# Patient Record
Sex: Female | Born: 1980
Health system: Southern US, Community
[De-identification: ages and names within clinical notes are randomized; demographics above are authoritative.]

## PROBLEM LIST (undated history)

## (undated) DIAGNOSIS — K760 Fatty (change of) liver, not elsewhere classified: Secondary | ICD-10-CM

## (undated) HISTORY — DX: Fatty (change of) liver, not elsewhere classified: K76.0

---
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
## 2003-11-06 ENCOUNTER — Emergency Department (HOSPITAL_COMMUNITY): Admission: EM | Admit: 2003-11-06 | Discharge: 2003-11-07 | Payer: Self-pay | Admitting: Emergency Medicine

## 2006-03-26 ENCOUNTER — Emergency Department (HOSPITAL_COMMUNITY): Admission: EM | Admit: 2006-03-26 | Discharge: 2006-03-26 | Payer: Self-pay | Admitting: Emergency Medicine

## 2011-04-17 ENCOUNTER — Ambulatory Visit (INDEPENDENT_AMBULATORY_CARE_PROVIDER_SITE_OTHER): Payer: BC Managed Care – PPO

## 2011-04-17 DIAGNOSIS — R059 Cough, unspecified: Secondary | ICD-10-CM

## 2011-04-17 DIAGNOSIS — J111 Influenza due to unidentified influenza virus with other respiratory manifestations: Secondary | ICD-10-CM

## 2011-04-17 DIAGNOSIS — R05 Cough: Secondary | ICD-10-CM

## 2012-09-19 ENCOUNTER — Ambulatory Visit (INDEPENDENT_AMBULATORY_CARE_PROVIDER_SITE_OTHER): Payer: BC Managed Care – PPO | Admitting: Internal Medicine

## 2012-09-19 VITALS — BP 142/90 | HR 92 | Temp 98.3°F | Resp 16 | Ht 63.0 in | Wt 225.0 lb

## 2012-09-19 DIAGNOSIS — J029 Acute pharyngitis, unspecified: Secondary | ICD-10-CM

## 2012-09-19 DIAGNOSIS — R05 Cough: Secondary | ICD-10-CM

## 2012-09-19 DIAGNOSIS — J329 Chronic sinusitis, unspecified: Secondary | ICD-10-CM

## 2012-09-19 MED ORDER — AMOXICILLIN 500 MG PO CAPS: 1000.0000 mg | ORAL_CAPSULE | Freq: Two times a day (BID) | ORAL | Status: DC

## 2012-09-19 NOTE — Patient Instructions (Addendum)

## 2012-09-19 NOTE — Progress Notes (Signed)
  Subjective:    Patient ID: Amber Stein, female    DOB: 16-Dec-1980, 32 y.o.   MRN: [id number]  HPI Patient complains of sinus pressure, sore throat, congestion and cough starting on Tuesday.  Patient did record a fever of 99.8 this am and took ibuprofen to bring temperature down.  Coughing up yellow sputum.  Patient's ears feel clogged as well. Patient denies SOB or chest pain.     Review of Systems     Objective:   Physical Exam  Vitals reviewed. Constitutional: She is oriented to person, place, and time. She appears well-developed and well-nourished. No distress.  HENT:  Right Ear: External ear normal.  Left Ear: External ear normal.  Nose: Mucosal edema, rhinorrhea and sinus tenderness present. No epistaxis. Right sinus exhibits maxillary sinus tenderness and frontal sinus tenderness. Left sinus exhibits maxillary sinus tenderness and frontal sinus tenderness.  Mouth/Throat: Oropharynx is clear and moist.  Pulmonary/Chest: Effort normal.  Neurological: She is alert and oriented to person, place, and time. She exhibits normal muscle tone. Coordination normal.  Skin: No rash noted.  Psychiatric: She has a normal mood and affect.          Assessment & Plan:  Amoxil 10 d

## 2013-04-23 ENCOUNTER — Ambulatory Visit (INDEPENDENT_AMBULATORY_CARE_PROVIDER_SITE_OTHER): Payer: BC Managed Care – PPO | Admitting: Family Medicine

## 2013-04-23 VITALS — BP 144/90 | HR 92 | Temp 98.8°F | Resp 16 | Ht 63.25 in | Wt 245.0 lb

## 2013-04-23 DIAGNOSIS — J329 Chronic sinusitis, unspecified: Secondary | ICD-10-CM

## 2013-04-23 DIAGNOSIS — R059 Cough, unspecified: Secondary | ICD-10-CM

## 2013-04-23 DIAGNOSIS — J209 Acute bronchitis, unspecified: Secondary | ICD-10-CM

## 2013-04-23 DIAGNOSIS — R05 Cough: Secondary | ICD-10-CM

## 2013-04-23 MED ORDER — BENZONATATE 100 MG PO CAPS: ORAL_CAPSULE | ORAL | Status: DC

## 2013-04-23 MED ORDER — AMOXICILLIN 875 MG PO TABS: 875.0000 mg | ORAL_TABLET | Freq: Two times a day (BID) | ORAL | Status: DC

## 2013-04-23 MED ORDER — HYDROCODONE-HOMATROPINE 5-1.5 MG/5ML PO SYRP: 5.0000 mL | ORAL_SOLUTION | ORAL | Status: DC | PRN

## 2013-04-23 NOTE — Progress Notes (Signed)
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 She has been getting sick for about 5 days. It started first sore throat, then she developed more cough and head congestion. She is now blowing and coughing purulent green-looking material. She's not improving. She's not febrile. She does not feel the achy bad feel a funny influenza although a coworker did have influenza.  Objective: Mildly ill appearing with coughing. Her TMs are normal. Eyes normal. Throat mildly injected. Neck supple without significant nodes. Chest is clear to. Heart regular without murmurs. No wheezing was noted.  Assessment: Upper respiratory infection with secondary bronchitis and sinusitis  Plan: Hycodan, Tessalon, and amoxicillin  Return if not improving or getting worse at anytime

## 2013-04-23 NOTE — Patient Instructions (Signed)
Drink plenty of fluids and get enough rest  Good handwashing  Take amoxicillin one twice daily for infection  Use the hydrocodone cough syrup (Hycodan) 1 teaspoon every 4-6 hours when necessary. It will make you drowsy.  Take Tessalon (benzonatate) cough pills 3 times daily if needed for cough, nonsedating. Not as good as the cough syrup.

## 2013-06-23 ENCOUNTER — Ambulatory Visit (INDEPENDENT_AMBULATORY_CARE_PROVIDER_SITE_OTHER): Payer: BC Managed Care – PPO | Admitting: Physician Assistant

## 2013-06-23 VITALS — BP 134/82 | HR 107 | Temp 98.6°F | Resp 20 | Ht 64.0 in | Wt 256.0 lb

## 2013-06-23 DIAGNOSIS — J111 Influenza due to unidentified influenza virus with other respiratory manifestations: Secondary | ICD-10-CM

## 2013-06-23 DIAGNOSIS — Z20828 Contact with and (suspected) exposure to other viral communicable diseases: Secondary | ICD-10-CM

## 2013-06-23 DIAGNOSIS — R69 Illness, unspecified: Principal | ICD-10-CM

## 2013-06-23 DIAGNOSIS — R509 Fever, unspecified: Secondary | ICD-10-CM

## 2013-06-23 LAB — POCT CBC
GRANULOCYTE PERCENT: 74.3 % (ref 37–80)
HCT, POC: 39.5 % (ref 37.7–47.9)
Hemoglobin: 12.7 g/dL (ref 12.2–16.2)
Lymph, poc: 1.7 (ref 0.6–3.4)
MCH, POC: 29.1 pg (ref 27–31.2)
MCHC: 32.2 g/dL (ref 31.8–35.4)
MCV: 90.7 fL (ref 80–97)
MID (CBC): 0.8 (ref 0–0.9)
MPV: 9 fL (ref 0–99.8)
POC GRANULOCYTE: 7.1 — AB (ref 2–6.9)
POC LYMPH %: 17.6 % (ref 10–50)
POC MID %: 8.1 %M (ref 0–12)
Platelet Count, POC: 264 10*3/uL (ref 142–424)
RBC: 4.36 M/uL (ref 4.04–5.48)
RDW, POC: 13.3 %
WBC: 9.5 10*3/uL (ref 4.6–10.2)

## 2013-06-23 LAB — POCT INFLUENZA A/B
INFLUENZA A, POC: NEGATIVE
Influenza B, POC: NEGATIVE

## 2013-06-23 MED ORDER — OSELTAMIVIR PHOSPHATE 75 MG PO CAPS: 75.0000 mg | ORAL_CAPSULE | Freq: Two times a day (BID) | ORAL | Status: DC

## 2013-06-23 MED ORDER — BENZONATATE 100 MG PO CAPS: 100.0000 mg | ORAL_CAPSULE | Freq: Three times a day (TID) | ORAL | Status: DC | PRN

## 2013-06-23 MED ORDER — HYDROCODONE-HOMATROPINE 5-1.5 MG/5ML PO SYRP: 5.0000 mL | ORAL_SOLUTION | Freq: Three times a day (TID) | ORAL | Status: DC | PRN

## 2013-06-23 NOTE — Progress Notes (Signed)
Subjective:    Patient ID: Amber Stein, female    DOB: 1981/01/18, 33 y.o.   MRN: [id number]  HPI   [name] is a very pleasant 33 yr old female here with concern for illness. Symptoms began yesterday morning.  Symptoms include sore throat, cough, "mucus," HA, upset stomach, body aches.  Fever tmax 100.57F.  Cough is mostly dry but occ productive of yellow mucus.  Abrupt onset of symptoms.  Pt works at dialysis center, has had lots of pts with flu.  She did have her flu shot this year.  Has been using ibuprofen and mucinex for symptoms.     Review of Systems  Constitutional: Positive for fever.  HENT: Positive for congestion, rhinorrhea and sore throat. Negative for ear pain.   Respiratory: Positive for cough. Negative for shortness of breath and wheezing.   Cardiovascular: Negative.   Gastrointestinal: Positive for nausea. Negative for vomiting and abdominal pain.  Musculoskeletal: Positive for arthralgias and myalgias.  Skin: Negative.   Neurological: Positive for headaches.       Objective:   Physical Exam  Vitals reviewed. Constitutional: She is oriented to person, place, and time. She appears well-developed and well-nourished. No distress.  HENT:  Head: Normocephalic and atraumatic.  Right Ear: Tympanic membrane is injected.  Left Ear: Tympanic membrane is injected.  Nose: Mucosal edema and rhinorrhea present.  Mouth/Throat: Uvula is midline and mucous membranes are normal. Posterior oropharyngeal erythema present. No oropharyngeal exudate or posterior oropharyngeal edema.  Eyes: Conjunctivae are normal. No scleral icterus.  Neck: Neck supple.  Cardiovascular: Normal rate, regular rhythm and normal heart sounds.   Pulmonary/Chest: Effort normal and breath sounds normal. She has no wheezes. She has no rales.  Abdominal: Soft. There is no tenderness.  Lymphadenopathy:    She has no cervical adenopathy.  Neurological: She is alert and oriented to person, place, and  time.  Skin: Skin is warm and dry.  Psychiatric: She has a normal mood and affect. Her behavior is normal.    Results for orders placed in visit on 06/23/13  POCT CBC      Result Value Ref Range   WBC 9.5  4.6 - 10.2 K/uL   Lymph, poc 1.7  0.6 - 3.4   POC LYMPH PERCENT 17.6  10 - 50 %L   MID (cbc) 0.8  0 - 0.9   POC MID % 8.1  0 - 12 %M   POC Granulocyte 7.1 (*) 2 - 6.9   Granulocyte percent 74.3  37 - 80 %G   RBC 4.36  4.04 - 5.48 M/uL   Hemoglobin 12.7  12.2 - 16.2 g/dL   HCT, POC 40.9  73.5 - 47.9 %   MCV 90.7  80 - 97 fL   MCH, POC 29.1  27 - 31.2 pg   MCHC 32.2  31.8 - 35.4 g/dL   RDW, POC 32.9     Platelet Count, POC 264  142 - 424 K/uL   MPV 9.0  0 - 99.8 fL  POCT INFLUENZA A/B      Result Value Ref Range   Influenza A, POC Negative     Influenza B, POC Negative           Assessment & Plan:  1. Influenza-like illness [name] is a very pleasant 33 yr old female here with influenza-like illness.  Abrupt onset of flu-like symptoms yesterday morning.  Known flu exposures at work.  Flu test is negative today.  CBC is  normal.  Will treat presumptively with tamiflu.  Tessalon and Hycodan for cough.  Advil, Tylenol for fever reduction.  Push fluids, rest.  OOW until fever free for 24 hours without medication  - benzonatate (TESSALON) 100 MG capsule; Take 1-2 capsules (100-200 mg total) by mouth 3 (three) times daily as needed for cough.  Dispense: 40 capsule; Refill: 0 - HYDROcodone-homatropine (HYCODAN) 5-1.5 MG/5ML syrup; Take 5 mLs by mouth every 8 (eight) hours as needed for cough.  Dispense: 30 mL; Refill: 0 - oseltamivir (TAMIFLU) 75 MG capsule; Take 1 capsule (75 mg total) by mouth 2 (two) times daily.  Dispense: 10 capsule; Refill: 0  2. Fever, unspecified - POCT CBC - POCT Influenza A/B  3. Exposure to influenza  Pt to call or RTC if worsening or not improving  E. Frances FurbishElizabeth Ieasha Boerema MHS, PA-C [facility] 2/24/201512:53 PM

## 2013-06-23 NOTE — Patient Instructions (Signed)
Begin taking Tamiflu tonight.  Finish the full course.  Tylenol and/or Advil to keep fever down  Tessalon Perles every 8 hours as needed for cough  Hycodan syrup if needed for cough at bed time - will make you sleepy  Plenty of fluids (water is best!) and rest  Out of work until fever free for 24 hours without Advil or Tylenol  Please let us know if any symptoms are worsening or not improving   Influenza, Adult Influenza ("the flu") is a viral infection of the respiratory tract. It occurs more often in winter months because people spend more time in close contact with one another. Influenza can make you feel very sick. Influenza easily spreads from person to person (contagious). CAUSES  Influenza is caused by a virus that infects the respiratory tract. You can catch the virus by breathing in droplets from an infected person's cough or sneeze. You can also catch the virus by touching something that was recently contaminated with the virus and then touching your mouth, nose, or eyes. SYMPTOMS  Symptoms typically last 4 to 10 days and may include:  Fever.  Chills.  Headache, body aches, and muscle aches.  Sore throat.  Chest discomfort and cough.  Poor appetite.  Weakness or feeling tired.  Dizziness.  Nausea or vomiting. DIAGNOSIS  Diagnosis of influenza is often made based on your history and a physical exam. A nose or throat swab test can be done to confirm the diagnosis. RISKS AND COMPLICATIONS You may be at risk for a more severe case of influenza if you smoke cigarettes, have diabetes, have chronic heart disease (such as heart failure) or lung disease (such as asthma), or if you have a weakened immune system. Elderly people and pregnant women are also at risk for more serious infections. The most common complication of influenza is a lung infection (pneumonia). Sometimes, this complication can require emergency medical care and may be life-threatening. PREVENTION  An  annual influenza vaccination (flu shot) is the best way to avoid getting influenza. An annual flu shot is now routinely recommended for all adults in the [place] TREATMENT  In mild cases, influenza goes away on its own. Treatment is directed at relieving symptoms. For more severe cases, your caregiver may prescribe antiviral medicines to shorten the sickness. Antibiotic medicines are not effective, because the infection is caused by a virus, not by bacteria. HOME CARE INSTRUCTIONS  Only take over-the-counter or prescription medicines for pain, discomfort, or fever as directed by your caregiver.  Use a cool mist humidifier to make breathing easier.  Get plenty of rest until your temperature returns to normal. This usually takes 3 to 4 days.  Drink enough fluids to keep your urine clear or pale yellow.  Cover your mouth and nose when coughing or sneezing, and wash your hands well to avoid spreading the virus.  Stay home from work or school until your fever has been gone for at least 1 full day. SEEK MEDICAL CARE IF:   You have chest pain or a deep cough that worsens or produces more mucus.  You have nausea, vomiting, or diarrhea. SEEK IMMEDIATE MEDICAL CARE IF:   You have difficulty breathing, shortness of breath, or your skin or nails turn bluish.  You have severe neck pain or stiffness.  You have a severe headache, facial pain, or earache.  You have a worsening or recurring fever.  You have nausea or vomiting that cannot be controlled. MAKE SURE YOU:  Understand these instructions.  Will watch your condition.  Will get help right away if you are not doing well or get worse. Document Released: 04/14/2000 Document Revised: 10/17/2011 Document Reviewed: 07/17/2011 [facility] Patient Information 2014 [place], [place].

## 2014-04-03 ENCOUNTER — Ambulatory Visit (INDEPENDENT_AMBULATORY_CARE_PROVIDER_SITE_OTHER): Payer: BC Managed Care – PPO | Admitting: Internal Medicine

## 2014-04-03 VITALS — BP 134/80 | HR 94 | Temp 98.3°F | Resp 18 | Ht 64.0 in | Wt 266.4 lb

## 2014-04-03 DIAGNOSIS — R69 Illness, unspecified: Secondary | ICD-10-CM

## 2014-04-03 DIAGNOSIS — J0100 Acute maxillary sinusitis, unspecified: Secondary | ICD-10-CM

## 2014-04-03 DIAGNOSIS — J111 Influenza due to unidentified influenza virus with other respiratory manifestations: Secondary | ICD-10-CM

## 2014-04-03 MED ORDER — AMOXICILLIN 500 MG PO CAPS: 1000.0000 mg | ORAL_CAPSULE | Freq: Two times a day (BID) | ORAL | Status: AC

## 2014-04-03 MED ORDER — HYDROCODONE-HOMATROPINE 5-1.5 MG/5ML PO SYRP: 5.0000 mL | ORAL_SOLUTION | Freq: Three times a day (TID) | ORAL | Status: DC | PRN

## 2014-04-03 NOTE — Progress Notes (Signed)
Subjective:    Patient ID: Amber Stein, female    DOB: 07-29-1980, 33 y.o.   MRN: [id number]  This chart was scribed for Ellamae Sia, MD by Ronney Lion, ED Scribe. This patient was seen in room 13 and the patient's care was started at 11:38 AM.   Sinusitis Associated symptoms include congestion, coughing and sinus pressure.  Sore Throat  Associated symptoms include congestion and coughing.  Cough    HPI Comments: Amber Stein is a 33 y.o. female who presents to the [facility] complaining of congestion that began about a week ago. She notes associated facial pressure, ear popping, a fever at onset of illness that has since resolved, and productive coughing that worsens at night. She has tried Mucinex, Robitussin, and Sudafed for her symptoms.   Active Ambulatory Problems    Diagnosis Date Noted  . No Active Ambulatory Problems   Resolved Ambulatory Problems    Diagnosis Date Noted  . No Resolved Ambulatory Problems   No Additional Past Medical History   Prior to Admission medications   Medication Sig Start Date End Date Taking? Authorizing Provider  amoxicillin (AMOXIL) 500 MG capsule Take 2 capsules (1,000 mg total) by mouth 2 (two) times daily. 04/03/14 04/13/14  Tonye Pearson, MD  benzonatate (TESSALON) 100 MG capsule Take 1-2 capsules (100-200 mg total) by mouth 3 (three) times daily as needed for cough. Patient not taking: Reported on 04/03/2014 06/23/13   Godfrey Pick, PA-C  HYDROcodone-homatropine [facility]) 5-1.5 MG/5ML syrup Take 5 mLs by mouth every 8 (eight) hours as needed for cough. 04/03/14   Tonye Pearson, MD  oseltamivir (TAMIFLU) 75 MG capsule Take 1 capsule (75 mg total) by mouth 2 (two) times daily. Patient not taking: Reported on 04/03/2014 06/23/13   Godfrey Pick, PA-C   has No Known Allergies.   Review of Systems  HENT: Positive for congestion and sinus pressure.   Respiratory: Positive for cough.          Objective:   Physical Exam  Constitutional: She is oriented to person, place, and time. She appears well-developed and well-nourished. No distress.  HENT:  Head: Normocephalic and atraumatic.  Right Ear: Tympanic membrane normal.  Left Ear: Tympanic membrane normal.  Purulent discharge from the nose.   Tender max areas to perc  Eyes: EOM are normal. Right conjunctiva is injected. Left conjunctiva is injected.  Neck: Neck supple. No tracheal deviation present.  Cardiovascular: Normal rate.   Pulmonary/Chest: Effort normal and breath sounds normal. No respiratory distress.  Lungs are clear to auscultation.  Musculoskeletal: Normal range of motion.  Neurological: She is alert and oriented to person, place, and time.  Skin: Skin is warm and dry.  Psychiatric: She has a normal mood and affect. Her behavior is normal.  Nursing note and vitals reviewed.         Assessment & Plan:   11:42 AM-Discussed treatment plan which includes Amoxicillin and Hycodan with pt and pt agreed to plan.   Acute maxillary sinusitis, recurrence not specified  Influenza-like illness - Plan: HYDROcodone-homatropine (HYCODAN) 5-1.5 MG/5ML syrup  Meds ordered this encounter  Medications  . HYDROcodone-homatropine (HYCODAN) 5-1.5 MG/5ML syrup    Sig: Take 5 mLs by mouth every 8 (eight) hours as needed for cough.    Dispense:  30 mL    Refill:  0  . amoxicillin (AMOXIL) 500 MG capsule    Sig: Take 2 capsules (1,000 mg total) by mouth 2 (two)  times daily.    Dispense:  40 capsule    Refill:  0   I have completed the patient encounter in its entirety as documented by the scribe, with editing by me where necessary. Currie Dennin P. Merla Richesoolittle, M.D.

## 2015-09-03 ENCOUNTER — Ambulatory Visit (INDEPENDENT_AMBULATORY_CARE_PROVIDER_SITE_OTHER): Payer: BLUE CROSS/BLUE SHIELD | Admitting: Emergency Medicine

## 2015-09-03 VITALS — BP 148/98 | HR 105 | Temp 98.6°F | Resp 20 | Ht 64.0 in | Wt 265.0 lb

## 2015-09-03 DIAGNOSIS — J208 Acute bronchitis due to other specified organisms: Secondary | ICD-10-CM | POA: Diagnosis not present

## 2015-09-03 DIAGNOSIS — R059 Cough, unspecified: Secondary | ICD-10-CM

## 2015-09-03 DIAGNOSIS — J0100 Acute maxillary sinusitis, unspecified: Secondary | ICD-10-CM | POA: Diagnosis not present

## 2015-09-03 DIAGNOSIS — R05 Cough: Secondary | ICD-10-CM | POA: Diagnosis not present

## 2015-09-03 DIAGNOSIS — J111 Influenza due to unidentified influenza virus with other respiratory manifestations: Secondary | ICD-10-CM

## 2015-09-03 DIAGNOSIS — R69 Illness, unspecified: Secondary | ICD-10-CM

## 2015-09-03 DIAGNOSIS — R509 Fever, unspecified: Secondary | ICD-10-CM | POA: Diagnosis not present

## 2015-09-03 LAB — POCT RAPID STREP A (OFFICE): Rapid Strep A Screen: NEGATIVE

## 2015-09-03 LAB — POCT CBC
Granulocyte percent: 73.6 %G (ref 37–80)
HEMATOCRIT: 36.4 % — AB (ref 37.7–47.9)
HEMOGLOBIN: 12.9 g/dL (ref 12.2–16.2)
Lymph, poc: 2.1 (ref 0.6–3.4)
MCH: 29.5 pg (ref 27–31.2)
MCHC: 35.3 g/dL (ref 31.8–35.4)
MCV: 83.5 fL (ref 80–97)
MID (CBC): 0.8 (ref 0–0.9)
MPV: 7.4 fL (ref 0–99.8)
POC GRANULOCYTE: 8 — AB (ref 2–6.9)
POC LYMPH PERCENT: 19.5 %L (ref 10–50)
POC MID %: 6.9 % (ref 0–12)
Platelet Count, POC: 340 10*3/uL (ref 142–424)
RBC: 4.36 M/uL (ref 4.04–5.48)
RDW, POC: 13 %
WBC: 10.9 10*3/uL — AB (ref 4.6–10.2)

## 2015-09-03 MED ORDER — AZITHROMYCIN 250 MG PO TABS: ORAL_TABLET | ORAL | Status: DC

## 2015-09-03 MED ORDER — BENZONATATE 100 MG PO CAPS: 100.0000 mg | ORAL_CAPSULE | Freq: Three times a day (TID) | ORAL | Status: DC | PRN

## 2015-09-03 MED ORDER — AMOXICILLIN-POT CLAVULANATE 875-125 MG PO TABS: 1.0000 | ORAL_TABLET | Freq: Two times a day (BID) | ORAL | Status: DC

## 2015-09-03 MED ORDER — HYDROCODONE-HOMATROPINE 5-1.5 MG/5ML PO SYRP: 5.0000 mL | ORAL_SOLUTION | Freq: Three times a day (TID) | ORAL | Status: DC | PRN

## 2015-09-03 NOTE — Progress Notes (Addendum)
By signing my name below, I, Raven Small, attest that this documentation has been prepared under the direction and in the presence of Lesle Chris, MD.  Electronically Signed: Andrew Au, ED Scribe. 09/03/2015. 8:20 AM.   Chief Complaint: No chief complaint on file.   HPI: Amber Stein is a 35 y.o. female who reports to [facility] today complaining of cough that began 5 days ago. Pt states symptoms started 6 days ago with a scratchy throat, initially suspecting this was due to allergies. The following day she developed sinus pressure, sinus congestion, sore throat, cough and a low grade fever of 99.5. She has tried mucinex and tylenol sinus HA without relief to symptoms. Symptoms have persisted throughout the week. She denies hx of asthma but has used an inhaler in the past for bronchitis. 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 She denies SOB and upper teeth pain. She is not on birth control and denies chance of pregnancy. No drug allergies.   History reviewed. No pertinent past medical history. History reviewed. No pertinent past surgical history. Social History   Social History  . Marital Status: Single    Spouse Name: N/A  . Number of Children: N/A  . Years of Education: N/A   Social History Main Topics  . Smoking status: Never Smoker   . Smokeless tobacco: None  . Alcohol Use: No  . Drug Use: No  . Sexual Activity: Not Asked   Other Topics Concern  . None   Social History Narrative   History reviewed. No pertinent family history. No Known Allergies Prior to Admission medications   Medication Sig Start Date End Date Taking? Authorizing Provider  benzonatate (TESSALON) 100 MG capsule Take 1-2 capsules (100-200 mg total) by mouth 3 (three) times daily as needed for cough. Patient not taking: Reported on 04/03/2014 06/23/13   Godfrey Pick, PA-C  HYDROcodone-homatropine [facility]) 5-1.5 MG/5ML syrup Take 5 mLs by mouth every 8 (eight) hours as needed  for cough. Patient not taking: Reported on 09/03/2015 04/03/14   Tonye Pearson, MD  oseltamivir (TAMIFLU) 75 MG capsule Take 1 capsule (75 mg total) by mouth 2 (two) times daily. Patient not taking: Reported on 04/03/2014 06/23/13   Godfrey Pick, PA-C     ROS: The patient denies, night sweats, unintentional weight loss, chest pain, palpitations, wheezing, dyspnea on exertion, nausea, vomiting, abdominal pain, dysuria, hematuria, melena, numbness, weakness, or tingling.   All other systems have been reviewed and were otherwise negative with the exception of those mentioned in the HPI and as above.    PHYSICAL EXAM: Filed Vitals:   09/03/15 0816  BP: 146/100  Pulse: 124  Temp: 99.4 F (37.4 C)  Resp: 18   Body mass index is 45.46 kg/(m^2).   General: Alert, no acute distress. Ill with frequent coughing spells. HEENT:  Normocephalic, atraumatic. Throat is slightly red.Patient is tender over both maxillary sinuses.  Eye: [facility] Cardiovascular:  Regular rate and rhythm, no rubs murmurs or gallops.  No Carotid bruits, radial pulse intact. No pedal edema.  Respiratory: Clear to auscultation bilaterally.  No wheezes, rales, or rhonchi.  No cyanosis, no use of accessory musculature. Chest was clear with high pitch cough. Abdominal: No organomegaly, abdomen is soft and non-tender, positive bowel sounds.  No masses. Musculoskeletal: Gait intact. No edema, tenderness Skin: No rashes. Neurologic: Facial musculature symmetric. Psychiatric: Patient acts appropriately throughout our interaction. Lymphatic: No cervical or submandibular lymphadenopathy  Meds ordered this encounter  Medications  . HYDROcodone-homatropine (HYCODAN) 5-1.5 MG/5ML syrup    Sig: Take 5 mLs by mouth every 8 (eight) hours as needed for cough.    Dispense:  30 mL    Refill:  0  . benzonatate (TESSALON) 100 MG capsule    Sig: Take 1-2 capsules (100-200 mg total) by mouth 3 (three) times daily as needed for  cough.    Dispense:  40 capsule    Refill:  0  . DISCONTD: azithromycin (ZITHROMAX) 250 MG tablet    Sig: Take 2 tabs PO x 1 dose, then 1 tab PO QD x 4 days    Dispense:  6 tablet    Refill:  0  . amoxicillin-clavulanate (AUGMENTIN) 875-125 MG tablet    Sig: Take 1 tablet by mouth 2 (two) times daily.    Dispense:  20 tablet    Refill:  0    LABS: Results for orders placed or performed in visit on 09/03/15  POCT CBC  Result Value Ref Range   WBC 10.9 (A) 4.6 - 10.2 K/uL   Lymph, poc 2.1 0.6 - 3.4   POC LYMPH PERCENT 19.5 10 - 50 %L   MID (cbc) 0.8 0 - 0.9   POC MID % 6.9 0 - 12 %M   POC Granulocyte 8.0 (A) 2 - 6.9   Granulocyte percent 73.6 37 - 80 %G   RBC 4.36 4.04 - 5.48 M/uL   Hemoglobin 12.9 12.2 - 16.2 g/dL   HCT, POC 56.3 (A) 14.9 - 47.9 %   MCV 83.5 80 - 97 fL   MCH, POC 29.5 27 - 31.2 pg   MCHC 35.3 31.8 - 35.4 g/dL   RDW, POC 70.2 %   Platelet Count, POC 340 142 - 424 K/uL   MPV 7.4 0 - 99.8 fL  POCT rapid strep A  Result Value Ref Range   Rapid Strep A Screen Negative Negative   ASSESSMENT/PLAN: White count is elevated. Strep Test was negative. Will treat with Augmentin and cough medications. Out of work. recheck in 48 hours if not improving.I personally performed the services described in this documentation, which was scribed in my presence. The recorded information has been reviewed and is accurate. I suspect she has a bilateral acute maxillary sinusitis. She does have a significant headache.   Gross sideeffects, risk and benefits, and alternatives of medications d/w patient. Patient is aware that all medications have potential sideeffects and we are unable to predict every sideeffect or drug-drug interaction that may occur.  Lesle Chris MD 09/03/2015 8:20 AM

## 2015-09-03 NOTE — Patient Instructions (Addendum)
Please recheck 48 hours if not improving.    IF you received an x-ray today, you will receive an invoice from [facility]. Please contact [facility] at [phone] with questions or concerns regarding your invoice.   IF you received labwork today, you will receive an invoice from [employer]. Please contact [facility] at [phone] with questions or concerns regarding your invoice.   Our billing staff will not be able to assist you with questions regarding bills from these companies.  You will be contacted with the lab results as soon as they are available. The fastest way to get your results is to activate your My Chart account. Instructions are located on the last page of this paperwork. If you have not heard from [place] regarding the results in 2 weeks, please contact this office.    Sinusitis, Adult Sinusitis is redness, soreness, and inflammation of the paranasal sinuses. Paranasal sinuses are air pockets within the bones of your face. They are located beneath your eyes, in the middle of your forehead, and above your eyes. In healthy paranasal sinuses, mucus is able to drain out, and air is able to circulate through them by way of your nose. However, when your paranasal sinuses are inflamed, mucus and air can become trapped. This can allow bacteria and other germs to grow and cause infection. Sinusitis can develop quickly and last only a short time (acute) or continue over a long period (chronic). Sinusitis that lasts for more than 12 weeks is considered chronic. CAUSES Causes of sinusitis include:  Allergies.  Structural abnormalities, such as displacement of the cartilage that separates your nostrils (deviated septum), which can decrease the air flow through your nose and sinuses and affect sinus drainage.  Functional abnormalities, such as when the small hairs (cilia) that line your sinuses and help remove mucus do not work properly or are not  present. SIGNS AND SYMPTOMS Symptoms of acute and chronic sinusitis are the same. The primary symptoms are pain and pressure around the affected sinuses. Other symptoms include:  Upper toothache.  Earache.  Headache.  Bad breath.  Decreased sense of smell and taste.  A cough, which worsens when you are lying flat.  Fatigue.  Fever.  Thick drainage from your nose, which often is green and may contain pus (purulent).  Swelling and warmth over the affected sinuses. DIAGNOSIS Your health care provider will perform a physical exam. During your exam, your health care provider may perform any of the following to help determine if you have acute sinusitis or chronic sinusitis:  Look in your nose for signs of abnormal growths in your nostrils (nasal polyps).  Tap over the affected sinus to check for signs of infection.  View the inside of your sinuses using an imaging device that has a light attached (endoscope). If your health care provider suspects that you have chronic sinusitis, one or more of the following tests may be recommended:  Allergy tests.  Nasal culture. A sample of mucus is taken from your nose, sent to a lab, and screened for bacteria.  Nasal cytology. A sample of mucus is taken from your nose and examined by your health care provider to determine if your sinusitis is related to an allergy. TREATMENT Most cases of acute sinusitis are related to a viral infection and will resolve on their own within 10 days. Sometimes, medicines are prescribed to help relieve symptoms of both acute and chronic sinusitis. These may include pain medicines, decongestants, nasal steroid sprays, or  saline sprays. However, for sinusitis related to a bacterial infection, your health care provider will prescribe antibiotic medicines. These are medicines that will help kill the bacteria causing the infection. Rarely, sinusitis is caused by a fungal infection. In these cases, your health care  provider will prescribe antifungal medicine. For some cases of chronic sinusitis, surgery is needed. Generally, these are cases in which sinusitis recurs more than 3 times per year, despite other treatments. HOME CARE INSTRUCTIONS  Drink plenty of water. Water helps thin the mucus so your sinuses can drain more easily.  Use a humidifier.  Inhale steam 3-4 times a day (for example, sit in the bathroom with the shower running).  Apply a warm, moist washcloth to your face 3-4 times a day, or as directed by your health care provider.  Use saline nasal sprays to help moisten and clean your sinuses.  Take medicines only as directed by your health care provider.  If you were prescribed either an antibiotic or antifungal medicine, finish it all even if you start to feel better. SEEK IMMEDIATE MEDICAL CARE IF:  You have increasing pain or severe headaches.  You have nausea, vomiting, or drowsiness.  You have swelling around your face.  You have vision problems.  You have a stiff neck.  You have difficulty breathing.   This information is not intended to replace advice given to you by your health care provider. Make sure you discuss any questions you have with your health care provider.   Document Released: 04/17/2005 Document Revised: 05/08/2014 Document Reviewed: 05/02/2011 Elsevier Interactive Patient Education [employer]

## 2016-05-08 ENCOUNTER — Ambulatory Visit (INDEPENDENT_AMBULATORY_CARE_PROVIDER_SITE_OTHER): Payer: 59 | Admitting: Physician Assistant

## 2016-05-08 ENCOUNTER — Ambulatory Visit (INDEPENDENT_AMBULATORY_CARE_PROVIDER_SITE_OTHER): Payer: 59

## 2016-05-08 VITALS — BP 154/97 | HR 105 | Temp 98.1°F | Resp 18 | Ht 64.0 in | Wt 273.0 lb

## 2016-05-08 DIAGNOSIS — R05 Cough: Secondary | ICD-10-CM

## 2016-05-08 DIAGNOSIS — R059 Cough, unspecified: Secondary | ICD-10-CM

## 2016-05-08 DIAGNOSIS — R030 Elevated blood-pressure reading, without diagnosis of hypertension: Secondary | ICD-10-CM

## 2016-05-08 DIAGNOSIS — R000 Tachycardia, unspecified: Secondary | ICD-10-CM | POA: Diagnosis not present

## 2016-05-08 MED ORDER — HYDROCODONE-HOMATROPINE 5-1.5 MG/5ML PO SYRP: 2.5000 mL | ORAL_SOLUTION | Freq: Two times a day (BID) | ORAL | 0 refills | Status: DC | PRN

## 2016-05-08 MED ORDER — BENZONATATE 200 MG PO CAPS: 200.0000 mg | ORAL_CAPSULE | Freq: Two times a day (BID) | ORAL | 0 refills | Status: DC | PRN

## 2016-05-08 NOTE — Progress Notes (Signed)
05/08/2016 11:38 AM   DOB: 1980-09-17 / MRN: [id number]  SUBJECTIVE:  Amber Stein is a 36 y.o. female presenting for a URI that resolved however cough started three days ago.  She denies a history of asthma and smoking. She reports that she is not pregnant today because she is on her cycle. SHe has been taking pseudoephedrine for relief of upper URI symptoms.   She has No Known Allergies.   She  has no past medical history on file.    She  reports that she has never smoked. She has never used smokeless tobacco. She reports that she does not drink alcohol or use drugs. She  has no sexual activity history on file. The patient  has no past surgical history on file.  Her family history is not on file.  Review of Systems  Constitutional: Negative for fever.  Respiratory: Positive for cough and sputum production. Negative for hemoptysis, shortness of breath and wheezing.   Cardiovascular: Negative for chest pain.  Neurological: Negative for dizziness.    The problem list and medications were reviewed and updated by myself where necessary and exist elsewhere in the encounter.   OBJECTIVE:  BP (!) 154/97 (BP Location: Right Arm, Patient Position: Sitting, Cuff Size: Large)   Pulse (!) 105   Temp 98.1 F (36.7 C) (Oral)   Resp 18   Ht 5\' 4"  (1.626 m)   Wt 273 lb (123.8 kg)   LMP 05/02/2016   SpO2 98%   BMI 46.86 kg/m   Wt Readings from Last 3 Encounters:  05/08/16 273 lb (123.8 kg)  09/03/15 265 lb (120.2 kg)  04/03/14 266 lb 6.4 oz (120.8 kg)   Temp Readings from Last 3 Encounters:  05/08/16 98.1 F (36.7 C) (Oral)  09/03/15 98.6 F (37 C) (Oral)  04/03/14 98.3 F (36.8 C) (Oral)   BP Readings from Last 3 Encounters:  05/08/16 (!) 154/97  09/03/15 (!) 148/98  04/03/14 134/80   Pulse Readings from Last 3 Encounters:  05/08/16 (!) 105  09/03/15 (!) 105  04/03/14 94     Physical Exam  Constitutional: She is oriented to person, place, and time. She appears  well-developed and well-nourished.  HENT:  Mouth/Throat: No oropharyngeal exudate.  Cardiovascular: Normal rate and regular rhythm.   Pulmonary/Chest: Effort normal and breath sounds normal.  Musculoskeletal: Normal range of motion.  Neurological: She is alert and oriented to person, place, and time.  Skin: Skin is warm and dry. She is not diaphoretic.    No results found for this or any previous visit (from the past 72 hour(s)).  Dg Chest 2 View  Result Date: 05/08/2016 CLINICAL DATA:  Cough, tachycardia EXAM: CHEST  2 VIEW COMPARISON:  02/01/2009 FINDINGS: Cardiomediastinal silhouette is stable. No infiltrate or pleural effusion. No pulmonary edema. Mild perihilar bronchitic changes. Minimal degenerative changes mid thoracic spine. IMPRESSION: No infiltrate or pulmonary edema. Mild perihilar bronchitic changes. Electronically Signed   By: Natasha Mead M.D.   On: 05/08/2016 11:27    ASSESSMENT AND PLAN:  Tonni was seen today for cough and sinusitis.  Diagnoses and all orders for this visit:  Cough Comments: Rads clear. HPI and exam reassuring. Most likely viral/post viral.  Hycodan and tessalon for now. Orders: -     DG Chest 2 View; Future -     HYDROcodone-homatropine (HYCODAN) 5-1.5 MG/5ML syrup; Take 2.5-5 mLs by mouth 2 (two) times daily as needed for cough. -     benzonatate (TESSALON) 200 MG capsule;  Take 1 capsule (200 mg total) by mouth 2 (two) times daily as needed for cough.  Tachycardia Comments: Most likely 2/2 pseudoephedrine admin.  I've advised she stop this.   Elevated blood pressure reading    The patient is advised to call or return to clinic if she does not see an improvement in symptoms, or to seek the care of the closest emergency department if she worsens with the above plan.   Deliah Boston, MHS, PA-C [facility] 05/08/2016 11:38 AM

## 2016-05-08 NOTE — Patient Instructions (Addendum)
Please purchase a blood pressure cuff and if pressure is consistently greater than 140/90 then return to clinic with you blood pressure diary.     IF you received an x-ray today, you will receive an invoice from [facility]. Please contact [facility] at [phone] with questions or concerns regarding your invoice.   IF you received labwork today, you will receive an invoice from [facility]. Please contact [facility] at [phone] with questions or concerns regarding your invoice.   Our billing staff will not be able to assist you with questions regarding bills from these companies.  You will be contacted with the lab results as soon as they are available. The fastest way to get your results is to activate your My Chart account. Instructions are located on the last page of this paperwork. If you have not heard from [place] regarding the results in 2 weeks, please contact this office.

## 2017-02-08 ENCOUNTER — Ambulatory Visit (INDEPENDENT_AMBULATORY_CARE_PROVIDER_SITE_OTHER): Payer: BLUE CROSS/BLUE SHIELD | Admitting: Physician Assistant

## 2017-02-08 ENCOUNTER — Encounter: Payer: Self-pay | Admitting: Physician Assistant

## 2017-02-08 VITALS — BP 148/88 | HR 98 | Temp 97.9°F | Resp 16 | Ht 64.0 in | Wt 249.6 lb

## 2017-02-08 DIAGNOSIS — J069 Acute upper respiratory infection, unspecified: Secondary | ICD-10-CM | POA: Diagnosis not present

## 2017-02-08 MED ORDER — AMOXICILLIN 875 MG PO TABS: 875.0000 mg | ORAL_TABLET | Freq: Two times a day (BID) | ORAL | 0 refills | Status: AC

## 2017-02-08 MED ORDER — PSEUDOEPHEDRINE-NAPROXEN NA ER 120-220 MG PO TB12: ORAL_TABLET | ORAL | 0 refills | Status: DC

## 2017-02-08 NOTE — Progress Notes (Signed)
02/08/2017 11:39 AM   DOB: [birth year] / MRN: [id number]  SUBJECTIVE:  Amber Stein is a 36 y.o. female presenting for nasal congestion, sore throat, cough all of which started about 2 days ago.  Up all night coughing last night. Fever up to 100.6 this morning.  Has been taking 800 mg of ibuprofen which helps.  Has tried flonase too.    She has No Known Allergies.   She  has no past medical history on file.    She  reports that she has never smoked. She has never used smokeless tobacco. She reports that she does not drink alcohol or use drugs. She  has no sexual activity history on file. The patient  has no past surgical history on file.  Her family history is not on file.  Review of Systems  Constitutional: Negative for chills, diaphoresis and fever.  Eyes: Negative.   Respiratory: Negative for cough, hemoptysis, sputum production, shortness of breath and wheezing.   Cardiovascular: Negative for chest pain, orthopnea and leg swelling.  Gastrointestinal: Negative for nausea.  Skin: Negative for rash.  Neurological: Negative for dizziness, sensory change, speech change, focal weakness and headaches.    The problem list and medications were reviewed and updated by myself where necessary and exist elsewhere in the encounter.   OBJECTIVE:  BP (!) 148/88 (BP Location: Left Arm, Patient Position: Sitting, Cuff Size: Large)   Pulse 98   Temp 97.9 F (36.6 C) (Oral)   Resp 16   Ht 5\' 4"  (1.626 m)   Wt 249 lb 9.6 oz (113.2 kg)   LMP 02/03/2017   SpO2 97%   BMI 42.84 kg/m   Physical Exam  Constitutional: She is active.  Non-toxic appearance.  HENT:  Right Ear: Hearing, tympanic membrane, external ear and ear canal normal.  Left Ear: Hearing, tympanic membrane, external ear and ear canal normal.  Nose: Nose normal. Right sinus exhibits no maxillary sinus tenderness and no frontal sinus tenderness. Left sinus exhibits no maxillary sinus tenderness and no frontal sinus  tenderness.  Mouth/Throat: Uvula is midline, oropharynx is clear and moist and mucous membranes are normal. Mucous membranes are not dry. No oropharyngeal exudate, posterior oropharyngeal edema or tonsillar abscesses.  Cardiovascular: Normal rate, regular rhythm, S1 normal, S2 normal, normal heart sounds and intact distal pulses.  Exam reveals no gallop, no friction rub and no decreased pulses.   No murmur heard. Pulmonary/Chest: Effort normal. No stridor. No tachypnea. No respiratory distress. She has no wheezes. She has no rales.  Abdominal: She exhibits no distension.  Musculoskeletal: She exhibits no edema.  Lymphadenopathy:       Head (right side): No submandibular and no tonsillar adenopathy present.       Head (left side): No submandibular and no tonsillar adenopathy present.    She has no cervical adenopathy.  Neurological: She is alert.  Skin: Skin is warm and dry. She is not diaphoretic. No pallor.    No results found for this or any previous visit (from the past 72 hour(s)).  No results found.  ASSESSMENT AND PLAN:  Amber Stein was seen today for sinus problem.  Diagnoses and all orders for this visit:  Acute URI: Advised she hold abx per sig as her symptoms are most likely 2/2 viral cause.  -     amoxicillin (AMOXIL) 875 MG tablet; Take 1 tablet (875 mg total) by mouth 2 (two) times daily. Fill only if not improving by day 5-6 of illness. -  Pseudoephedrine-Naproxen Na 120-220 MG TB12; Take one tab every 12 hours for nasal congestion    The patient is advised to call or return to clinic if she does not see an improvement in symptoms, or to seek the care of the closest emergency department if she worsens with the above plan.   Deliah Boston, MHS, PA-C Primary Care at [facility] 02/08/2017 11:39 AM

## 2017-02-08 NOTE — Patient Instructions (Signed)
     IF you received an x-ray today, you will receive an invoice from [facility]. Please contact [facility] at [phone] with questions or concerns regarding your invoice.   IF you received labwork today, you will receive an invoice from [facility]. Please contact [facility] at [phone] with questions or concerns regarding your invoice.   Our billing staff will not be able to assist you with questions regarding bills from these companies.  You will be contacted with the lab results as soon as they are available. The fastest way to get your results is to activate your My Chart account. Instructions are located on the last page of this paperwork. If you have not heard from us regarding the results in 2 weeks, please contact this office.     

## 2017-05-05 IMAGING — DX DG CHEST 2V
2 series · 2 of 2 positions shown · non-contrast
Comparison: 02/01/2009

CLINICAL DATA: Cough, tachycardia

EXAM:
CHEST  2 VIEW

[chest pa]
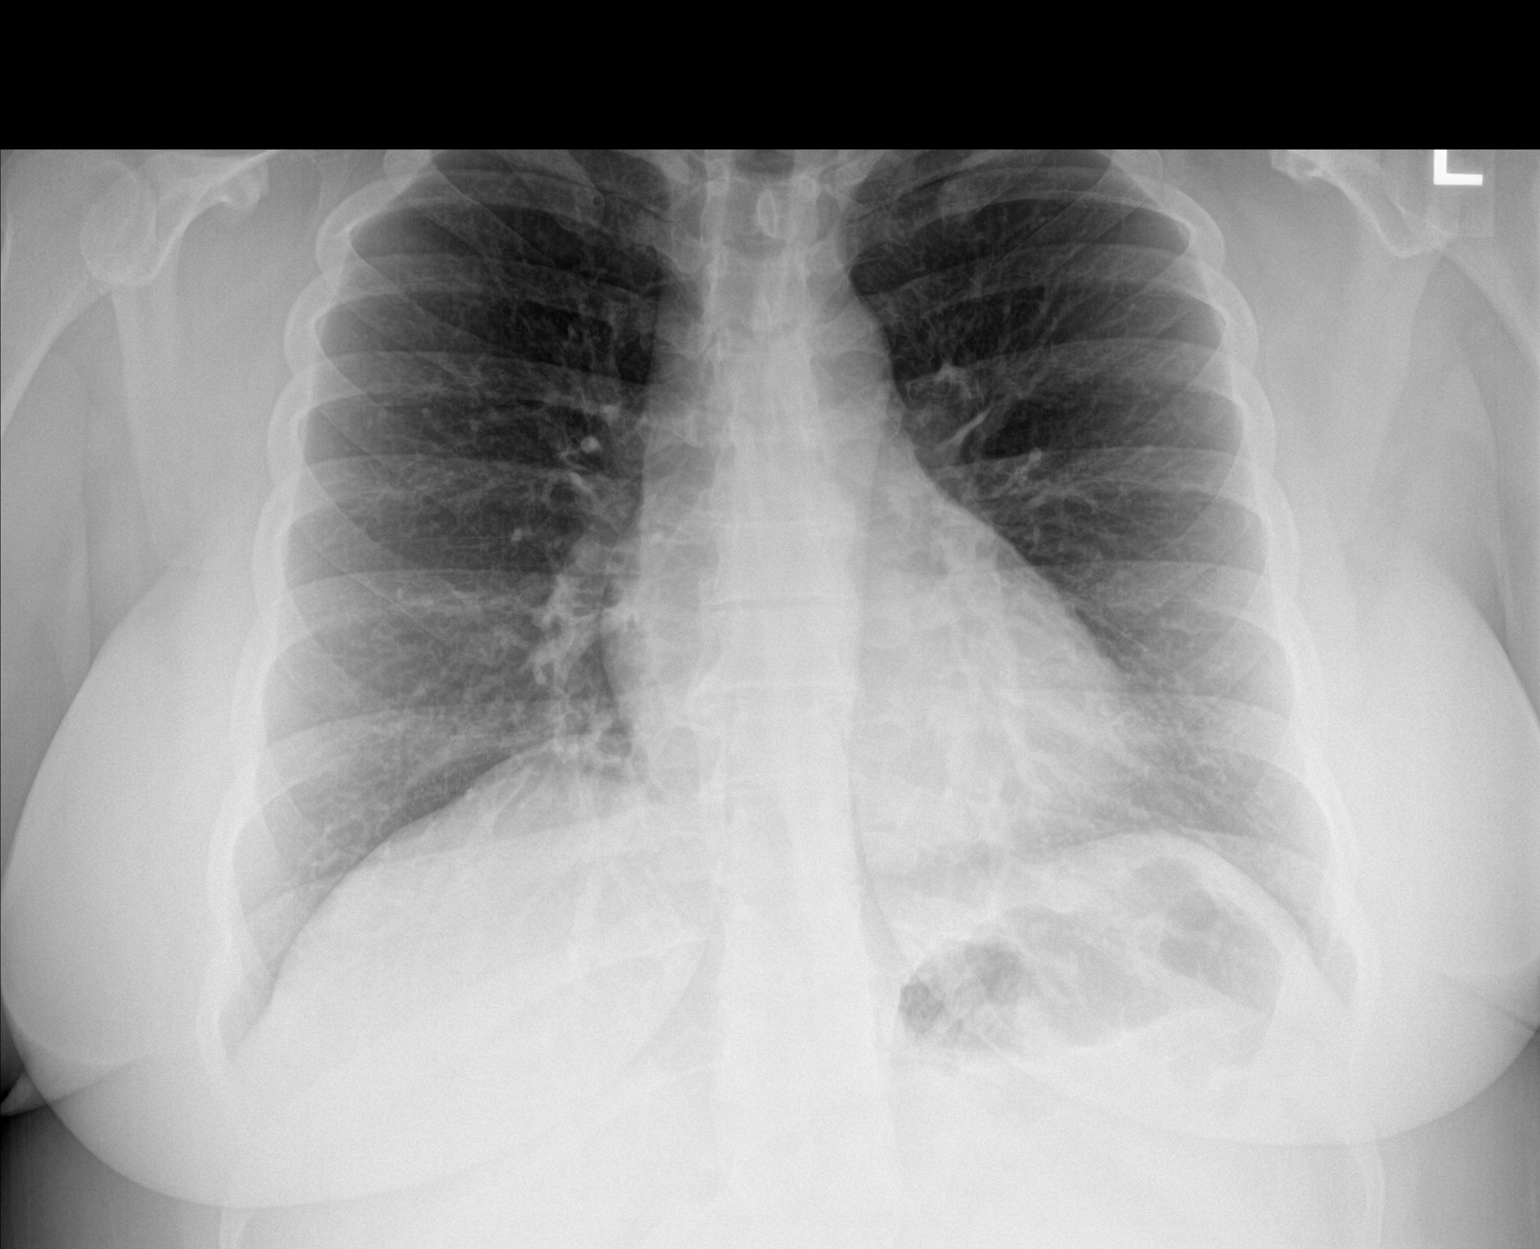

[chest lat]
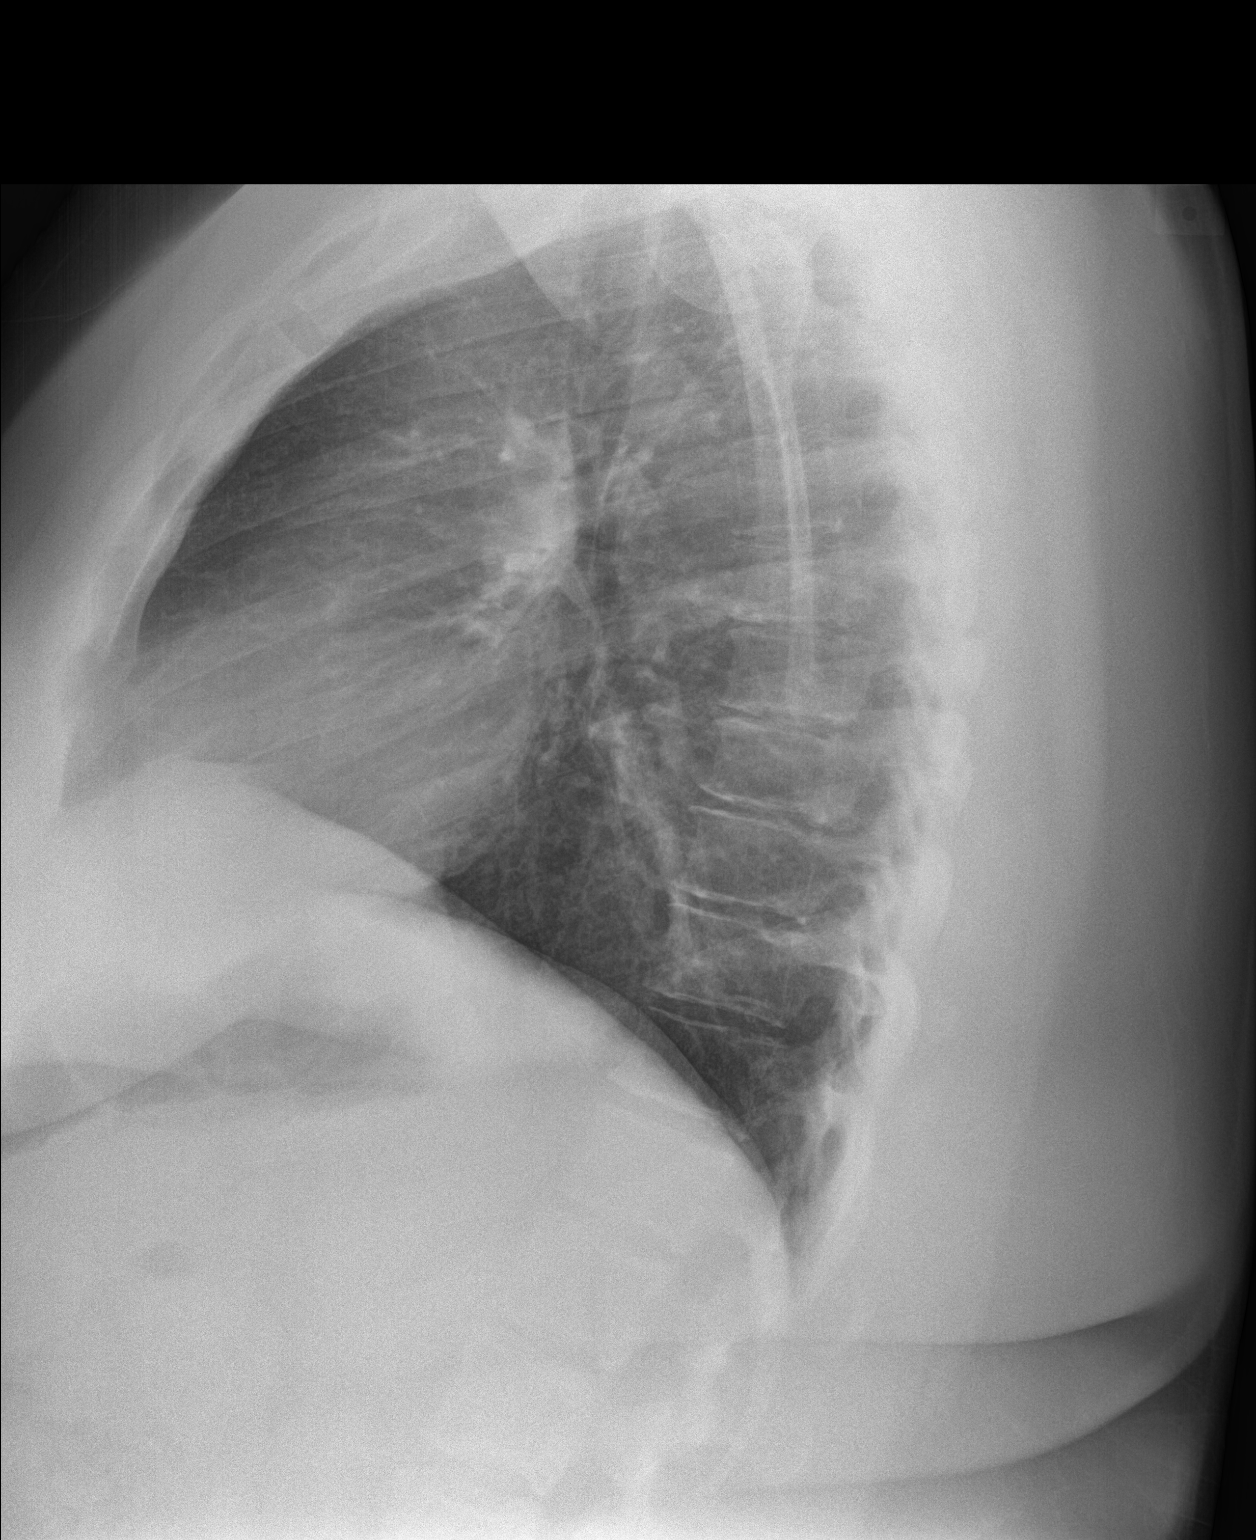

[2 of 2 positions shown; findings below may reference images not displayed]

FINDINGS: Cardiomediastinal silhouette is stable. No infiltrate or pleural
effusion. No pulmonary edema. Mild perihilar bronchitic changes.
Minimal degenerative changes mid thoracic spine.
IMPRESSION: No infiltrate or pulmonary edema. Mild perihilar bronchitic changes.

## 2018-04-17 ENCOUNTER — Ambulatory Visit: Payer: BLUE CROSS/BLUE SHIELD | Admitting: Family Medicine

## 2018-04-17 ENCOUNTER — Encounter: Payer: Self-pay | Admitting: Family Medicine

## 2018-04-17 ENCOUNTER — Other Ambulatory Visit: Payer: Self-pay

## 2018-04-17 VITALS — BP 160/94 | HR 115 | Temp 98.5°F | Resp 14 | Ht 62.0 in | Wt 282.2 lb

## 2018-04-17 DIAGNOSIS — R030 Elevated blood-pressure reading, without diagnosis of hypertension: Secondary | ICD-10-CM

## 2018-04-17 DIAGNOSIS — J069 Acute upper respiratory infection, unspecified: Secondary | ICD-10-CM | POA: Diagnosis not present

## 2018-04-17 MED ORDER — IPRATROPIUM BROMIDE 0.03 % NA SOLN: 2.0000 | Freq: Two times a day (BID) | NASAL | 0 refills | Status: DC

## 2018-04-17 MED ORDER — BENZONATATE 100 MG PO CAPS: 100.0000 mg | ORAL_CAPSULE | Freq: Three times a day (TID) | ORAL | 0 refills | Status: DC | PRN

## 2018-04-17 MED ORDER — AMOXICILLIN-POT CLAVULANATE 875-125 MG PO TABS: 1.0000 | ORAL_TABLET | Freq: Two times a day (BID) | ORAL | 0 refills | Status: DC

## 2018-04-17 NOTE — Progress Notes (Signed)
12/18/20194:57 PM  Deneane Stifter 1981/02/24, 37 y.o. female [id number]  Chief Complaint  Patient presents with  . URI    Cough, chest congestion, stuffy nose, green mucus, sorethroat, x3 day    HPI:   Patient is a 37 y.o. female who presents today for URI sx x 3 days  Running fevers on and off, T 100.9, yesterday Bad cough, productive yellow green Raspy, chest congestion Sore throat Runny nose, sinus pressure, swelling Body aches Had flu vaccine this season Mild SOB with sign activity Last sudafed cold and flu about 1pm Had been sick about 2 weeks ago and then started again  Fall Risk  04/17/2018 02/08/2017 05/08/2016 09/03/2015  Falls in the past year? 0 No No No     Depression screen [facility] 2/9 04/17/2018 02/08/2017 05/08/2016  Decreased Interest 0 0 0  Down, Depressed, Hopeless 0 0 0  PHQ - 2 Score 0 0 0    No Known Allergies  Prior to Admission medications   Medication Sig Start Date End Date Taking? Authorizing Provider  ibuprofen (ADVIL,MOTRIN) 200 MG tablet Take 200 mg by mouth every 6 (six) hours as needed.   Yes [provider]    No past medical history on file.  No past surgical history on file.  Social History   Tobacco Use  . Smoking status: Never Smoker  . Smokeless tobacco: Never Used  Substance Use Topics  . Alcohol use: No    Alcohol/week: 0.0 standard drinks    No family history on file.  ROS Per hpi  OBJECTIVE:  Blood pressure (!) 160/94, pulse (!) 115, temperature 98.5 F (36.9 C), temperature source Oral, resp. rate 14, height 5\' 2"  (1.575 m), weight 282 lb 3.2 oz (128 kg), SpO2 97 %. Body mass index is 51.62 kg/m.   Physical Exam Vitals signs and nursing note reviewed.  Constitutional:      Appearance: She is well-developed.  HENT:     Head: Normocephalic and atraumatic.     Right Ear: Hearing, tympanic membrane, ear canal and external ear normal.     Left Ear: Hearing, tympanic membrane, ear canal and external  ear normal.     Nose:     Right Turbinates: Enlarged.     Left Turbinates: Enlarged.     Right Sinus: Maxillary sinus tenderness (mild) and frontal sinus tenderness (mild) present.     Mouth/Throat:     Pharynx: No pharyngeal swelling, oropharyngeal exudate or posterior oropharyngeal erythema.     Comments: Post nasal drip Eyes:     Conjunctiva/sclera: Conjunctivae normal.     Pupils: Pupils are equal, round, and reactive to light.  Neck:     Musculoskeletal: Neck supple.  Cardiovascular:     Rate and Rhythm: Normal rate and regular rhythm.     Heart sounds: Normal heart sounds. No murmur. No friction rub. No gallop.   Pulmonary:     Effort: Pulmonary effort is normal.     Breath sounds: Normal breath sounds. No wheezing or rales.  Lymphadenopathy:     Cervical: No cervical adenopathy.  Skin:    General: Skin is warm and dry.  Neurological:     Mental Status: She is alert and oriented to person, place, and time.     ASSESSMENT and PLAN  1. Acute upper respiratory infection Discussed supportive measures for URI: increase hydration, rest, OTC medications, etc. RTC precautions discussed. Discussed delayed use of antibiotics if symptoms for sinus infection persist.   2. Elevated BP  Discussed no sudafed. Recheck BP nurse visit 1 week  Other orders - ipratropium (ATROVENT) 0.03 % nasal spray; Place 2 sprays into both nostrils 2 (two) times daily. - benzonatate (TESSALON) 100 MG capsule; Take 1-2 capsules (100-200 mg total) by mouth 3 (three) times daily as needed for cough. - amoxicillin-clavulanate (AUGMENTIN) 875-125 MG tablet; Take 1 tablet by mouth 2 (two) times daily.  Return if symptoms worsen or fail to improve.    Myles Lipps, MD Primary Care at [facility] [address] [place], [place] [address] Ph.  [phone] Fax [phone]

## 2018-04-17 NOTE — Patient Instructions (Addendum)
Discussed delay use of antibiotics   If you have lab work done today you will be contacted with your lab results within the next 2 weeks.  If you have not heard from [place] then please contact us. The fastest way to get your results is to register for My Chart.   IF you received an x-ray today, you will receive an invoice from [facility]. Please contact [facility] at [phone] with questions or concerns regarding your invoice.   IF you received labwork today, you will receive an invoice from [facility]. Please contact [facility] at [phone] with questions or concerns regarding your invoice.   Our billing staff will not be able to assist you with questions regarding bills from these companies.  You will be contacted with the lab results as soon as they are available. The fastest way to get your results is to activate your My Chart account. Instructions are located on the last page of this paperwork. If you have not heard from [place] regarding the results in 2 weeks, please contact this office.      Sinusitis, Adult Sinusitis is soreness and swelling (inflammation) of your sinuses. Sinuses are hollow spaces in the bones around your face. They are located:  Around your eyes.  In the middle of your forehead.  Behind your nose.  In your cheekbones. Your sinuses and nasal passages are lined with a fluid called mucus. Mucus drains out of your sinuses. Swelling can trap mucus in your sinuses. This lets germs (bacteria, virus, or fungus) grow, which leads to infection. Most of the time, this condition is caused by a virus. What are the causes? This condition is caused by:  Allergies.  Asthma.  Germs.  Things that block your nose or sinuses.  Growths in the nose (nasal polyps).  Chemicals or irritants in the air.  Fungus (rare). What increases the risk? You are more likely to develop this condition if:  You have a weak body defense system (immune system).  You  do a lot of swimming or diving.  You use nasal sprays too much.  You smoke. What are the signs or symptoms? The main symptoms of this condition are pain and a feeling of pressure around the sinuses. Other symptoms include:  Stuffy nose (congestion).  Runny nose (drainage).  Swelling and warmth in the sinuses.  Headache.  Toothache.  A cough that may get worse at night.  Mucus that collects in the throat or the back of the nose (postnasal drip).  Being unable to smell and taste.  Being very tired (fatigue).  A fever.  Sore throat.  Bad breath. How is this diagnosed? This condition is diagnosed based on:  Your symptoms.  Your medical history.  A physical exam.  Tests to find out if your condition is short-term (acute) or long-term (chronic). Your doctor may: ? Check your nose for growths (polyps). ? Check your sinuses using a tool that has a light (endoscope). ? Check for allergies or germs. ? Do imaging tests, such as an MRI or CT scan. How is this treated? Treatment for this condition depends on the cause and whether it is short-term or long-term.  If caused by a virus, your symptoms should go away on their own within 10 days. You may be given medicines to relieve symptoms. They include: ? Medicines that shrink swollen tissue in the nose. ? Medicines that treat allergies (antihistamines). ? A spray that treats swelling of the nostrils. ? Rinses that help get rid  of thick mucus in your nose (nasal saline washes).  If caused by bacteria, your doctor may wait to see if you will get better without treatment. You may be given antibiotic medicine if you have: ? A very bad infection. ? A weak body defense system.  If caused by growths in the nose, you may need to have surgery. Follow these instructions at home: Medicines  Take, use, or apply over-the-counter and prescription medicines only as told by your doctor. These may include nasal sprays.  If you were  prescribed an antibiotic medicine, take it as told by your doctor. Do not stop taking the antibiotic even if you start to feel better. Hydrate and humidify   Drink enough water to keep your pee (urine) pale yellow.  Use a cool mist humidifier to keep the humidity level in your home above 50%.  Breathe in steam for 10-15 minutes, 3-4 times a day, or as told by your doctor. You can do this in the bathroom while a hot shower is running.  Try not to spend time in cool or dry air. Rest  Rest as much as you can.  Sleep with your head raised (elevated).  Make sure you get enough sleep each night. General instructions   Put a warm, moist washcloth on your face 3-4 times a day, or as often as told by your doctor. This will help with discomfort.  Wash your hands often with soap and water. If there is no soap and water, use hand sanitizer.  Do not smoke. Avoid being around people who are smoking (secondhand smoke).  Keep all follow-up visits as told by your doctor. This is important. Contact a doctor if:  You have a fever.  Your symptoms get worse.  Your symptoms do not get better within 10 days. Get help right away if:  You have a very bad headache.  You cannot stop throwing up (vomiting).  You have very bad pain or swelling around your face or eyes.  You have trouble seeing.  You feel confused.  Your neck is stiff.  You have trouble breathing. Summary  Sinusitis is swelling of your sinuses. Sinuses are hollow spaces in the bones around your face.  This condition is caused by tissues in your nose that become inflamed or swollen. This traps germs. These can lead to infection.  If you were prescribed an antibiotic medicine, take it as told by your doctor. Do not stop taking it even if you start to feel better.  Keep all follow-up visits as told by your doctor. This is important. This information is not intended to replace advice given to you by your health care  provider. Make sure you discuss any questions you have with your health care provider. Document Released: 10/04/2007 Document Revised: 09/17/2017 Document Reviewed: 09/17/2017 Elsevier Interactive Patient Education  2019 [employer].

## 2019-02-28 ENCOUNTER — Other Ambulatory Visit: Payer: Self-pay

## 2019-02-28 ENCOUNTER — Ambulatory Visit: Payer: Commercial Managed Care - PPO | Admitting: Registered Nurse

## 2019-02-28 ENCOUNTER — Encounter: Payer: Self-pay | Admitting: Registered Nurse

## 2019-02-28 VITALS — BP 122/86 | HR 100 | Temp 98.2°F | Resp 16 | Ht 62.0 in | Wt 291.0 lb

## 2019-02-28 DIAGNOSIS — Z13220 Encounter for screening for lipoid disorders: Secondary | ICD-10-CM

## 2019-02-28 DIAGNOSIS — N926 Irregular menstruation, unspecified: Secondary | ICD-10-CM | POA: Insufficient documentation

## 2019-02-28 DIAGNOSIS — Z1329 Encounter for screening for other suspected endocrine disorder: Secondary | ICD-10-CM

## 2019-02-28 DIAGNOSIS — Z13228 Encounter for screening for other metabolic disorders: Secondary | ICD-10-CM

## 2019-02-28 DIAGNOSIS — Z7689 Persons encountering health services in other specified circumstances: Secondary | ICD-10-CM | POA: Diagnosis not present

## 2019-02-28 DIAGNOSIS — Z130 Encounter for screening for diseases of the blood and blood-forming organs and certain disorders involving the immune mechanism: Secondary | ICD-10-CM

## 2019-02-28 LAB — POCT URINALYSIS DIP (MANUAL ENTRY)
Bilirubin, UA: NEGATIVE
Glucose, UA: NEGATIVE mg/dL
Ketones, POC UA: NEGATIVE mg/dL
Leukocytes, UA: NEGATIVE
Nitrite, UA: NEGATIVE
Spec Grav, UA: 1.025 (ref 1.010–1.025)
Urobilinogen, UA: 0.2 E.U./dL
pH, UA: 5.5 (ref 5.0–8.0)

## 2019-02-28 LAB — POCT URINE PREGNANCY: Preg Test, Ur: NEGATIVE

## 2019-02-28 MED ORDER — NORETHINDRONE ACET-ETHINYL EST 1.5-30 MG-MCG PO TABS: 1.0000 | ORAL_TABLET | Freq: Every day | ORAL | 3 refills | Status: DC

## 2019-02-28 NOTE — Progress Notes (Signed)
Established Patient Office Visit  Subjective:  Patient ID: Amber Stein, female    DOB: 1980/07/24  Age: 38 y.o. MRN: [id number]  CC:  Chief Complaint  Patient presents with  . Establish Care    pt states that her menstral cycle has been longer than normal    HPI Amber Stein presents for visit to establish care. No major medical, surgical, or family history - all reviewed. Does not drink or smoke. Reports mediocre diet through pandemic. Married x 3 years, with monogamous partner for 16 years. Currently seeking pregnancy.  States that she has had aub with her current menstrual cycle. Had normal bleeding and cramping at first, but now it has lasted an extra week with worse cramping and intermittent bleeding. Endorses some radiation to the lower back. This has not happened before. It is not excruciating or lateralized. POC urine preg test negative. Pt has upcoming appt with OBGYN on 04/02/19  History reviewed. No pertinent past medical history.  History reviewed. No pertinent surgical history.  History reviewed. No pertinent family history.  Social History   Socioeconomic History  . Marital status: Married    Spouse name: Not on file  . Number of children: 0  . Years of education: Not on file  . Highest education level: Not on file  Occupational History  . Not on file  Social Needs  . Financial resource strain: Not hard at all  . Food insecurity    Worry: Never true    Inability: Never true  . Transportation needs    Medical: No    Non-medical: No  Tobacco Use  . Smoking status: Never Smoker  . Smokeless tobacco: Never Used  Substance and Sexual Activity  . Alcohol use: No    Alcohol/week: 0.0 standard drinks  . Drug use: No  . Sexual activity: Yes    Birth control/protection: None  Lifestyle  . Physical activity    Days per week: 3 days    Minutes per session: 30 min  . Stress: Only a little  Relationships  . Social Herbalist on  phone: Three times a week    Gets together: Twice a week    Attends religious service: Patient refused    Active member of club or organization: Patient refused    Attends meetings of clubs or organizations: Patient refused    Relationship status: Married  . Intimate partner violence    Fear of current or ex partner: No    Emotionally abused: No    Physically abused: No    Forced sexual activity: No  Other Topics Concern  . Not on file  Social History Narrative  . Not on file    Outpatient Medications Prior to Visit  Medication Sig Dispense Refill  . acetaminophen (TYLENOL) 500 MG tablet Take 500 mg by mouth every 6 (six) hours as needed.    . Ascorbic Acid (VITAMIN C) 1000 MG tablet Take 1,000 mg by mouth daily.    . cetirizine (ZYRTEC) 10 MG chewable tablet Chew 10 mg by mouth daily.    Marland Kitchen amoxicillin-clavulanate (AUGMENTIN) 875-125 MG tablet Take 1 tablet by mouth 2 (two) times daily. (Patient not taking: Reported on 02/28/2019) 14 tablet 0  . benzonatate (TESSALON) 100 MG capsule Take 1-2 capsules (100-200 mg total) by mouth 3 (three) times daily as needed for cough. (Patient not taking: Reported on 02/28/2019) 40 capsule 0  . ibuprofen (ADVIL,MOTRIN) 200 MG tablet Take 200 mg by mouth  every 6 (six) hours as needed.    Marland Kitchen ipratropium (ATROVENT) 0.03 % nasal spray Place 2 sprays into both nostrils 2 (two) times daily. (Patient not taking: Reported on 02/28/2019) 30 mL 0   No facility-administered medications prior to visit.     No Known Allergies  ROS Review of Systems  Constitutional: Negative.   HENT: Negative.   Eyes: Negative.   Respiratory: Negative.   Cardiovascular: Negative.   Gastrointestinal: Negative.   Endocrine: Negative.   Genitourinary: Positive for menstrual problem, pelvic pain and vaginal bleeding. Negative for decreased urine volume, difficulty urinating, dyspareunia, dysuria, enuresis, flank pain, frequency, genital sores, hematuria, urgency, vaginal  discharge and vaginal pain.  Musculoskeletal: Positive for back pain (mild radiation from pelvis).  Skin: Negative.   Allergic/Immunologic: Negative.   Neurological: Negative.   Hematological: Negative.   Psychiatric/Behavioral: Negative.   All other systems reviewed and are negative.     Objective:    Physical Exam  Constitutional: She is oriented to person, place, and time. She appears well-developed and well-nourished. No distress.  Cardiovascular: Normal rate and regular rhythm.  Pulmonary/Chest: Effort normal. No respiratory distress.  Neurological: She is alert and oriented to person, place, and time.  Skin: Skin is warm and dry. No rash noted. She is not diaphoretic. No erythema. No pallor.  Psychiatric: She has a normal mood and affect. Her behavior is normal. Judgment and thought content normal.  Nursing note and vitals reviewed.   BP 122/86   Pulse 100   Temp 98.2 F (36.8 C) (Oral)   Resp 16   Ht _0  (1.575 m)   Wt 291 lb (132 kg)   SpO2 98%   BMI 53.22 kg/m  Wt Readings from Last 3 Encounters:  02/28/19 291 lb (132 kg)  04/17/18 282 lb 3.2 oz (128 kg)  02/08/17 249 lb 9.6 oz (113.2 kg)     Health Maintenance Due  Topic Date Due  . HIV Screening  07/25/1997    There are no preventive care reminders to display for this patient.  No results found for: TSH Lab Results  Component Value Date   WBC 10.9 (A) 09/03/2015   HGB 12.9 09/03/2015   HCT 36.4 (A) 09/03/2015   MCV 83.5 09/03/2015   No results found for: NA, K, CHLORIDE, CO2, GLUCOSE, BUN, CREATININE, BILITOT, ALKPHOS, AST, ALT, PROT, ALBUMIN, CALCIUM, ANIONGAP, EGFR, GFR No results found for: CHOL No results found for: HDL No results found for: LDLCALC No results found for: TRIG No results found for: CHOLHDL No results found for: HGBA1C    Assessment & Plan:   Problem List Items Addressed This Visit    None    Visit Diagnoses    Encounter to establish care    -  Primary   Irregular  menstrual cycle       Relevant Orders   POCT urine pregnancy (Completed)   POCT urinalysis dipstick (Completed)   Comprehensive metabolic panel   TSH   Prolactin   Screening for endocrine, metabolic and immunity disorder       Relevant Orders   Comprehensive metabolic panel   Hemoglobin A1c   CBC with Differential   TSH   Prolactin   Lipid screening       Relevant Orders   Lipid panel      No orders of the defined types were placed in this encounter.   Follow-up: No follow-ups on file.   PLAN  Labs ordered: TSH, CBC w diff, CMP,  A1c, Lipids, Prolactin. Will follow up as warranted  POC A1c and urine dip negative  Will start Junel COC to help regulate cycle and control current bleeding - discussed this plan with patient, stating that this may help regulate cycle, or at least help bleeding and cramping until she is seen by OBGYN. If labs do not provide etiology, we will have deferred to OBGYN regardless.  Patient encouraged to call clinic with any questions, comments, or concerns.    Maximiano Coss, NP

## 2019-02-28 NOTE — Patient Instructions (Signed)
° ° ° °  If you have lab work done today you will be contacted with your lab results within the next 2 weeks.  If you have not heard from us then please contact us. The fastest way to get your results is to register for My Chart. ° ° °IF you received an x-ray today, you will receive an invoice from [facility]. Please contact [facility] at [phone] with questions or concerns regarding your invoice.  ° °IF you received labwork today, you will receive an invoice from [facility]. Please contact [facility] at [phone] with questions or concerns regarding your invoice.  ° °Our billing staff will not be able to assist you with questions regarding bills from these companies. ° °You will be contacted with the lab results as soon as they are available. The fastest way to get your results is to activate your My Chart account. Instructions are located on the last page of this paperwork. If you have not heard from us regarding the results in 2 weeks, please contact this office. °  ° ° ° °

## 2019-03-01 LAB — COMPREHENSIVE METABOLIC PANEL
ALT: 25 IU/L (ref 0–32)
AST: 18 IU/L (ref 0–40)
Albumin/Globulin Ratio: 1.7 (ref 1.2–2.2)
Albumin: 4.5 g/dL (ref 3.8–4.8)
Alkaline Phosphatase: 70 IU/L (ref 39–117)
BUN/Creatinine Ratio: 31 — ABNORMAL HIGH (ref 9–23)
BUN: 13 mg/dL (ref 6–20)
Bilirubin Total: 0.3 mg/dL (ref 0.0–1.2)
CO2: 20 mmol/L (ref 20–29)
Calcium: 9.1 mg/dL (ref 8.7–10.2)
Chloride: 102 mmol/L (ref 96–106)
Creatinine, Ser: 0.42 mg/dL — ABNORMAL LOW (ref 0.57–1.00)
GFR calc Af Amer: 152 mL/min/{1.73_m2} (ref >59)
GFR calc non Af Amer: 132 mL/min/{1.73_m2} (ref >59)
Globulin, Total: 2.7 g/dL (ref 1.5–4.5)
Glucose: 122 mg/dL — ABNORMAL HIGH (ref 65–99)
Potassium: 4.2 mmol/L (ref 3.5–5.2)
Sodium: 136 mmol/L (ref 134–144)
Total Protein: 7.2 g/dL (ref 6.0–8.5)

## 2019-03-01 LAB — CBC WITH DIFFERENTIAL/PLATELET
Basophils Absolute: 0 10*3/uL (ref 0.0–0.2)
Basos: 1 %
EOS (ABSOLUTE): 0.1 10*3/uL (ref 0.0–0.4)
Eos: 2 %
Hematocrit: 33 % — ABNORMAL LOW (ref 34.0–46.6)
Hemoglobin: 10.9 g/dL — ABNORMAL LOW (ref 11.1–15.9)
Immature Grans (Abs): 0 10*3/uL (ref 0.0–0.1)
Immature Granulocytes: 0 %
Lymphocytes Absolute: 2 10*3/uL (ref 0.7–3.1)
Lymphs: 27 %
MCH: 26.7 pg (ref 26.6–33.0)
MCHC: 33 g/dL (ref 31.5–35.7)
MCV: 81 fL (ref 79–97)
Monocytes Absolute: 0.5 10*3/uL (ref 0.1–0.9)
Monocytes: 7 %
Neutrophils Absolute: 4.8 10*3/uL (ref 1.4–7.0)
Neutrophils: 63 %
Platelets: 349 10*3/uL (ref 150–450)
RBC: 4.08 x10E6/uL (ref 3.77–5.28)
RDW: 12.8 % (ref 11.7–15.4)
WBC: 7.6 10*3/uL (ref 3.4–10.8)

## 2019-03-01 LAB — PROLACTIN: Prolactin: 8.6 ng/mL (ref 4.8–23.3)

## 2019-03-01 LAB — LIPID PANEL
Chol/HDL Ratio: 5.4 ratio — ABNORMAL HIGH (ref 0.0–4.4)
Cholesterol, Total: 200 mg/dL — ABNORMAL HIGH (ref 100–199)
HDL: 37 mg/dL — ABNORMAL LOW (ref >39)
LDL Chol Calc (NIH): 135 mg/dL — ABNORMAL HIGH (ref 0–99)
Triglycerides: 153 mg/dL — ABNORMAL HIGH (ref 0–149)
VLDL Cholesterol Cal: 28 mg/dL (ref 5–40)

## 2019-03-01 LAB — HEMOGLOBIN A1C
Est. average glucose Bld gHb Est-mCnc: 117 mg/dL
Hgb A1c MFr Bld: 5.7 % — ABNORMAL HIGH (ref 4.8–5.6)

## 2019-03-01 LAB — TSH: TSH: 2.52 u[IU]/mL (ref 0.450–4.500)

## 2019-03-03 ENCOUNTER — Encounter: Payer: Self-pay | Admitting: Registered Nurse

## 2019-03-03 NOTE — Progress Notes (Signed)
Labs returned Lipids high - pt seeking pregnancy, will refrain from statin and pursue lifestyle modifications Hgb low - start OTC iron supplement. Hopefully controlling menses will assist in having these numbers rebound  Kathrin Ruddy, NP

## 2019-04-22 ENCOUNTER — Other Ambulatory Visit: Payer: Self-pay

## 2022-03-31 DIAGNOSIS — R109 Unspecified abdominal pain: Secondary | ICD-10-CM | POA: Insufficient documentation

## 2022-05-05 ENCOUNTER — Other Ambulatory Visit: Payer: Self-pay | Admitting: Surgery

## 2022-05-05 DIAGNOSIS — R748 Abnormal levels of other serum enzymes: Secondary | ICD-10-CM | POA: Insufficient documentation

## 2022-05-09 ENCOUNTER — Emergency Department (HOSPITAL_COMMUNITY)
Admission: EM | Admit: 2022-05-09 | Discharge: 2022-05-09 | Disposition: A | Discharge status: Home / Self Care | Payer: 59 | Attending: Emergency Medicine | Admitting: Emergency Medicine

## 2022-05-09 ENCOUNTER — Other Ambulatory Visit: Payer: Self-pay

## 2022-05-09 ENCOUNTER — Encounter (HOSPITAL_COMMUNITY): Payer: Self-pay

## 2022-05-09 ENCOUNTER — Emergency Department (HOSPITAL_COMMUNITY): Payer: 59

## 2022-05-09 DIAGNOSIS — R7989 Other specified abnormal findings of blood chemistry: Secondary | ICD-10-CM | POA: Diagnosis not present

## 2022-05-09 DIAGNOSIS — R1011 Right upper quadrant pain: Secondary | ICD-10-CM | POA: Insufficient documentation

## 2022-05-09 DIAGNOSIS — I447 Left bundle-branch block, unspecified: Secondary | ICD-10-CM | POA: Diagnosis not present

## 2022-05-09 DIAGNOSIS — R197 Diarrhea, unspecified: Secondary | ICD-10-CM

## 2022-05-09 DIAGNOSIS — R9431 Abnormal electrocardiogram [ECG] [EKG]: Secondary | ICD-10-CM | POA: Insufficient documentation

## 2022-05-09 DIAGNOSIS — R112 Nausea with vomiting, unspecified: Secondary | ICD-10-CM | POA: Insufficient documentation

## 2022-05-09 LAB — URINALYSIS, ROUTINE W REFLEX MICROSCOPIC
Bilirubin Urine: NEGATIVE
Glucose, UA: NEGATIVE mg/dL
Hgb urine dipstick: NEGATIVE
Ketones, ur: NEGATIVE mg/dL
Leukocytes,Ua: NEGATIVE
Nitrite: NEGATIVE
Protein, ur: 100 mg/dL — AB
Specific Gravity, Urine: 1.008 (ref 1.005–1.030)
pH: 5 (ref 5.0–8.0)

## 2022-05-09 LAB — I-STAT BETA HCG BLOOD, ED (MC, WL, AP ONLY): I-stat hCG, quantitative: <5 m[IU]/mL (ref <5)

## 2022-05-09 LAB — COMPREHENSIVE METABOLIC PANEL
ALT: 166 U/L — ABNORMAL HIGH (ref 0–44)
AST: 133 U/L — ABNORMAL HIGH (ref 15–41)
Albumin: 4.3 g/dL (ref 3.5–5.0)
Alkaline Phosphatase: 47 U/L (ref 38–126)
Anion gap: 11 (ref 5–15)
BUN: 13 mg/dL (ref 6–20)
CO2: 23 mmol/L (ref 22–32)
Calcium: 9 mg/dL (ref 8.9–10.3)
Chloride: 104 mmol/L (ref 98–111)
Creatinine, Ser: 0.92 mg/dL (ref 0.44–1.00)
GFR, Estimated: >60 mL/min (ref >60)
Glucose, Bld: 109 mg/dL — ABNORMAL HIGH (ref 70–99)
Potassium: 4.1 mmol/L (ref 3.5–5.1)
Sodium: 138 mmol/L (ref 135–145)
Total Bilirubin: 1.7 mg/dL — ABNORMAL HIGH (ref 0.3–1.2)
Total Protein: 7 g/dL (ref 6.5–8.1)

## 2022-05-09 LAB — CBC
HCT: 44.8 % (ref 36.0–46.0)
Hemoglobin: 14.5 g/dL (ref 12.0–15.0)
MCH: 28.7 pg (ref 26.0–34.0)
MCHC: 32.4 g/dL (ref 30.0–36.0)
MCV: 88.5 fL (ref 80.0–100.0)
Platelets: 245 10*3/uL (ref 150–400)
RBC: 5.06 MIL/uL (ref 3.87–5.11)
RDW: 14.4 % (ref 11.5–15.5)
WBC: 7 10*3/uL (ref 4.0–10.5)
nRBC: 0 % (ref 0.0–0.2)

## 2022-05-09 LAB — HEPATITIS PANEL, ACUTE
HCV Ab: NONREACTIVE
Hep A IgM: NONREACTIVE
Hep B C IgM: NONREACTIVE
Hepatitis B Surface Ag: NONREACTIVE

## 2022-05-09 LAB — LIPASE, BLOOD: Lipase: 33 U/L (ref 11–51)

## 2022-05-09 MED ORDER — ONDANSETRON 4 MG PO TBDP: 4.0000 mg | ORAL_TABLET | Freq: Three times a day (TID) | ORAL | 0 refills | Status: DC | PRN

## 2022-05-09 NOTE — ED Triage Notes (Signed)
Patient said her right upper abdomen began hurting over a month ago. Vomiting and diarrhea associated. Went to a Psychologist, sport and exercise Friday but they referred her to get an ultrasound and told her while she was waiting to go to the ED if pain got severe.

## 2022-05-09 NOTE — ED Provider Notes (Signed)
[facility]-EMERGENCY DEPT Provider Note   CSN: [id number] Arrival date & time: 05/09/22  4098     History  Chief Complaint  Patient presents with   Abdominal Pain    Amber Stein is a 42 y.o. female with no significant past medical history who presents to the ED due to right upper quadrant abdominal pain that has been intermittent for the past month.  Abdominal pain associated with nausea, vomiting, and diarrhea.  Patient saw Dr. Rayburn Ma on 1/5 where routine labs were draw and an ultrasound was ordered.  Ultrasound has not been performed yet. Mildly elevated LFTs on labs.  Patient was advised to report to the ED if pain got worse.  Patient states pain got worse roughly 2 days ago.  Pain typically worse after eating and with movement.  No previous abdominal operations.  No urinary or vaginal symptoms.  Emesis is nonbloody and nonbilious.  Nonbloody diarrhea.  Patient states pain has improved from 2 days ago. No recent illness. No chest pain or shortness of breath.   History obtained from patient and past medical records. No interpreter used during encounter.       Home Medications Prior to Admission medications   Medication Sig Start Date End Date Taking? Authorizing Provider  ondansetron (ZOFRAN-ODT) 4 MG disintegrating tablet Take 1 tablet (4 mg total) by mouth every 8 (eight) hours as needed for nausea or vomiting. 05/09/22  Yes Luverta Korte, Merla Riches, PA-C  acetaminophen (TYLENOL) 500 MG tablet Take 500 mg by mouth every 6 (six) hours as needed.    [provider]  amoxicillin-clavulanate (AUGMENTIN) 875-125 MG tablet Take 1 tablet by mouth 2 (two) times daily. Patient not taking: Reported on 02/28/2019 04/17/18   Lezlie Lye, Meda Coffee, MD  Ascorbic Acid (VITAMIN C) 1000 MG tablet Take 1,000 mg by mouth daily.    [provider]  benzonatate (TESSALON) 100 MG capsule Take 1-2 capsules (100-200 mg total) by mouth 3 (three) times daily as  needed for cough. Patient not taking: Reported on 02/28/2019 04/17/18   Lezlie Lye, Meda Coffee, MD  cetirizine (ZYRTEC) 10 MG chewable tablet Chew 10 mg by mouth daily.    [provider]  ibuprofen (ADVIL,MOTRIN) 200 MG tablet Take 200 mg by mouth every 6 (six) hours as needed.    [provider]  ipratropium (ATROVENT) 0.03 % nasal spray Place 2 sprays into both nostrils 2 (two) times daily. Patient not taking: Reported on 02/28/2019 04/17/18   Lezlie Lye, Meda Coffee, MD  Norethindrone Acetate-Ethinyl Estradiol (LOESTRIN) 1.5-30 MG-MCG tablet Take 1 tablet by mouth daily. 02/28/19   Janeece Agee, NP      Allergies    Patient has no known allergies.    Review of Systems   Review of Systems  Constitutional:  Negative for chills and fever.  Respiratory:  Negative for shortness of breath.   Cardiovascular:  Negative for chest pain.  Gastrointestinal:  Positive for abdominal pain, diarrhea, nausea and vomiting.  Genitourinary:  Negative for dysuria and vaginal bleeding.  Musculoskeletal:  Negative for back pain.  All other systems reviewed and are negative.   Physical Exam Updated Vital Signs BP (!) 148/114 (BP Location: Right Arm)   Pulse (!) 103   Temp 98.1 F (36.7 C) (Oral)   Resp 19   Ht 5\' 3"  (1.6 m)   Wt 115.2 kg   SpO2 98%   BMI 44.99 kg/m  Physical Exam Vitals and nursing note reviewed.  Constitutional:  General: She is not in acute distress.    Appearance: She is not ill-appearing.  HENT:     Head: Normocephalic.  Eyes:     Pupils: Pupils are equal, round, and reactive to light.  Cardiovascular:     Rate and Rhythm: Normal rate and regular rhythm.     Pulses: Normal pulses.     Heart sounds: Normal heart sounds. No murmur heard.    No friction rub. No gallop.  Pulmonary:     Effort: Pulmonary effort is normal.     Breath sounds: Normal breath sounds.  Abdominal:     General: Abdomen is flat. There is no distension.     Palpations:  Abdomen is soft.     Tenderness: There is abdominal tenderness. There is no guarding or rebound.     Comments: RUQ tenderness  Musculoskeletal:        General: Normal range of motion.     Cervical back: Neck supple.  Skin:    General: Skin is warm and dry.  Neurological:     General: No focal deficit present.     Mental Status: She is alert.  Psychiatric:        Mood and Affect: Mood normal.        Behavior: Behavior normal.     ED Results / Procedures / Treatments   Labs (all labs ordered are listed, but only abnormal results are displayed) Labs Reviewed  COMPREHENSIVE METABOLIC PANEL - Abnormal; Notable for the following components:      Result Value   Glucose, Bld 109 (*)    AST 133 (*)    ALT 166 (*)    Total Bilirubin 1.7 (*)    All other components within normal limits  URINALYSIS, ROUTINE W REFLEX MICROSCOPIC - Abnormal; Notable for the following components:   APPearance HAZY (*)    Protein, ur 100 (*)    Bacteria, UA RARE (*)    All other components within normal limits  LIPASE, BLOOD  CBC  HEPATITIS PANEL, ACUTE  I-STAT BETA HCG BLOOD, ED ([facility], [facility], [facility] ONLY)    EKG EKG Interpretation  Date/Time:  Tuesday May 09 2022 16:13:31 EST Ventricular Rate:  105 PR Interval:  163 QRS Duration: 143 QT Interval:  368 QTC Calculation: 487 R Axis:   103 Text Interpretation: Sinus tachycardia LVH with secondary repolarization abnormality Anterior infarct, old Left bundle branch block No previous tracing Confirmed by Blanchie Dessert [phone]) on 05/09/2022 4:40:07 PM  Radiology US Abdomen Limited RUQ (LIVER/GB)  Result Date: 05/09/2022 CLINICAL DATA:  Right upper quadrant pain EXAM: ULTRASOUND ABDOMEN LIMITED RIGHT UPPER QUADRANT COMPARISON:  None Available. FINDINGS: Gallbladder: No gallstones or wall thickening visualized. No sonographic Murphy sign noted by sonographer. Common bile duct: Diameter: 3 mm. Liver: No focal lesion identified. Diffusely increased hepatic  parenchymal echogenicity. Portal vein is patent on color Doppler imaging with normal direction of blood flow towards the liver. Other: None. IMPRESSION: 1. The echogenicity of the liver is increased. This is a nonspecific finding but is most commonly seen with fatty infiltration of the liver. There are no obvious focal liver lesions. 2. Otherwise unremarkable ultrasound of the right upper quadrant. Electronically Signed   By: Davina Poke D.O.   On: 05/09/2022 12:22    Procedures Procedures    Medications Ordered in ED Medications - No data to display  ED Course/ Medical Decision Making/ A&P Clinical Course as of 05/09/22 1659  Tue May 09, 2022  1557 AST(!): 133 [  CA]  1557 ALT(!): 166 [CA]    Clinical Course User Index [CA] Mannie Stabile, PA-C                           Medical Decision Making Amount and/or Complexity of Data Reviewed External Data Reviewed: notes.    Details: General surgery note on 1/5 Labs: ordered. Decision-making details documented in ED Course. Radiology: ordered and independent interpretation performed. Decision-making details documented in ED Course.  Risk Prescription drug management.   This patient presents to the ED for concern of RUQ abdominal pain, this involves an extensive number of treatment options, and is a complaint that carries with it a high risk of complications and morbidity.  The differential diagnosis includes acute cholecystitis, liver etiology, pneumonia, PE, MSK etiology, etc  42 year old female presents to the ED due to intermittent right upper quadrant abdominal pain x 1 month associated with nausea, vomiting, and diarrhea.  Patient saw general surgery on 1/5 where her LFTs were mildly elevated. No chest pain.  Upon arrival, patient afebrile and tachycardic.  Normal oxygen saturation.  Patient in no acute distress.  Unfortunately, patient waited over 6 hours prior to my initial evaluation due to long wait times.  Mild tenderness  in right upper quadrant without rebound or guarding.  Negative Murphy sign.  Routine labs ordered at triage.  Ultrasound to rule out evidence of acute cholecystitis.  Offered patient pain medication and nausea medication which she declined. Added EKG to rule out cardiac etiology of pain.   CBC reassuring.  No leukocytosis.  Normal hemoglobin.  CMP significant for transaminitis.  AST at 133 and ALT at 166.  Normal lipase.  Doubt pancreatitis. UA negative for signs of infection. Pregnancy test negative. Doubt ectopic. US shows fatty liver. No gallbladder abnormalities.  4:37 PM I had long discussion with patient about her HR and location of pain and need for further work-up. Offered IVFs to see if HR improved and if not would add d-dimer to rule out PE given location of pain and tachycardia. It appears patient had elevated HR on 1/5 office visit. TSH normal in 2020. Patient has no risk factors for PE. Patient declined IVFs and further work-up of PE. EKG abdnormal as well. Shows left bundle branch block. No previous EKG to compare. Informed patient about abnormal EKG and she still declines further work-up. Husband very upset at bedside that all work-up was not initially done in triage. I had a long discussion with patient and husband to discuss MSE process and how is a screening tool and then additional labs or workup is typically added upon provider evaluation as deemed necessary.  Patient declined any further workup.  Advised patient to follow-up with PCP in regards to EKG and to recheck LFTs in 1 week.  No evidence of acute cholecystitis.  Hepatitis panel pending.  Patient given GI referral at discharge advised to call to schedule appointment for further evaluation. Strict ED precautions discussed with patient. Patient states understanding and agrees to plan. Patient discharged home in no acute distress and stable vitals.       Final Clinical Impression(s) / ED Diagnoses Final diagnoses:  RUQ pain   Nausea vomiting and diarrhea  Elevated LFTs  Left bundle branch block    Rx / DC Orders ED Discharge Orders          Ordered    ondansetron (ZOFRAN-ODT) 4 MG disintegrating tablet  Every 8 hours PRN  05/09/22 1645              Mannie Stabile, PA-C 05/09/22 1701    Lorre Nick, MD 05/10/22 [phone]

## 2022-05-09 NOTE — Discharge Instructions (Addendum)
It was a pleasure taking care of you today.  As discussed, your ultrasound showed fatty liver.  No evidence of gallstones or infection in the gallbladder.  Your liver enzymes were elevated.  Please have them rechecked in the next few days.  Your EKG was abnormal which was explained to you.  Please follow-up with PCP.  You were offered further workup of your elevated heart rate and abnormal EKG which you declined.  Please follow-up with PCP for further evaluation.  I have included the number of the GI doctor.  Please call to schedule an appointment for further evaluation of your elevated liver enzymes.  Return to the ER for any worsening symptoms.

## 2022-05-09 NOTE — ED Provider Triage Note (Addendum)
Emergency Medicine Provider Triage Evaluation Note  Amber Stein , a 42 y.o. female  was evaluated in triage.  Pt complains of 1 month of RUQ pain with n/v/d. Went to UC, referred to surgery ([facility]) and seen Friday but hasn't had an [place] and was scheduled to have [place]. Told to go to ER if worse. Pain is not getting better, is getting worse. Vomiting intermittent, last vomited last night. Non bloody emesis, no fevers. Had labs 05/05/22 at [facility] [place] (results in care everywhere), WBC normal, bili WNL, alk phos WNL. AST/ALT mildly elevated  Review of Systems  Positive: As above Negative: As above  Physical Exam  BP (!) 155/123   Pulse (!) 121   Temp 97.6 F (36.4 C) (Oral)   Resp 19   Ht 5\' 3"  (1.6 m)   Wt 115.2 kg   SpO2 99%   BMI 44.99 kg/m  Gen:   Awake, no distress   Resp:  Normal effort  MSK:   Moves extremities without difficulty  Other:    Medical Decision Making  Medically screening exam initiated at 9:56 AM.  Appropriate orders placed.  Ralyn Stlaurent was informed that the remainder of the evaluation will be completed by another provider, this initial triage assessment does not replace that evaluation, and the importance of remaining in the ED until their evaluation is complete.     Tacy Learn, PA-C 05/09/22 0954    Tacy Learn, PA-C 05/09/22 [phone]

## 2022-05-10 ENCOUNTER — Other Ambulatory Visit: Payer: Self-pay | Admitting: Surgery

## 2022-05-10 ENCOUNTER — Ambulatory Visit (INDEPENDENT_AMBULATORY_CARE_PROVIDER_SITE_OTHER): Payer: 59 | Admitting: Family Medicine

## 2022-05-10 ENCOUNTER — Encounter: Payer: Self-pay | Admitting: Family Medicine

## 2022-05-10 VITALS — BP 120/80 | HR 96 | Temp 98.0°F | Ht 63.0 in | Wt 255.2 lb

## 2022-05-10 DIAGNOSIS — R109 Unspecified abdominal pain: Secondary | ICD-10-CM | POA: Diagnosis not present

## 2022-05-10 DIAGNOSIS — R000 Tachycardia, unspecified: Secondary | ICD-10-CM

## 2022-05-10 DIAGNOSIS — K760 Fatty (change of) liver, not elsewhere classified: Secondary | ICD-10-CM | POA: Diagnosis not present

## 2022-05-10 DIAGNOSIS — Z23 Encounter for immunization: Secondary | ICD-10-CM

## 2022-05-10 DIAGNOSIS — R1010 Upper abdominal pain, unspecified: Secondary | ICD-10-CM

## 2022-05-10 DIAGNOSIS — R9431 Abnormal electrocardiogram [ECG] [EKG]: Secondary | ICD-10-CM | POA: Diagnosis not present

## 2022-05-10 DIAGNOSIS — R7401 Elevation of levels of liver transaminase levels: Secondary | ICD-10-CM

## 2022-05-10 LAB — IBC + FERRITIN
Ferritin: 81.4 ng/mL (ref 10.0–291.0)
Iron: 52 ug/dL (ref 42–145)
Saturation Ratios: 12.6 % — ABNORMAL LOW (ref 20.0–50.0)
TIBC: 411.6 ug/dL (ref 250.0–450.0)
Transferrin: 294 mg/dL (ref 212.0–360.0)

## 2022-05-10 LAB — TSH: TSH: 3.4 u[IU]/mL (ref 0.35–5.50)

## 2022-05-10 MED ORDER — PANTOPRAZOLE SODIUM 40 MG PO TBEC: 40.0000 mg | DELAYED_RELEASE_TABLET | Freq: Every day | ORAL | 1 refills | Status: DC

## 2022-05-10 NOTE — Progress Notes (Signed)
Chief Complaint  Patient presents with   New Patient (Initial Visit)    Abdominal pain        New Patient Visit SUBJECTIVE: HPI: Amber Stein is an 42 y.o.female who is being seen for establishing care.  Abd pain Pt has been dealing with RUQ and epigastric abd pain since 03/31/22. She went to [facility] and then on 12/5 saw general surgery. They ordered labs showing elevated LFT's. [place] also ordered. She went to ED yesterday due to worsening s/s's. RUQ US done showing fatty liver. LFT's even higher. Eating makes her sharp pain worse, nothing makes it better. She was rx'd Zofran but has not taken it yet. No other meds Rx'd. She does not drink alcohol. Stools are lighter colored and loose. No yellowing of skin, mouth or eyes. No blood in stool. She has had both nausea and vomiting. She has a hx of reflux but that is more burning in her chest. Acute hepatitis panel neg. She was referred to [facility] GI in the ED yesterday.   EKG EKG showed LBBB in ED. She was not having CP or SOB. No heart disease in family. She does not smoke or do illicit drugs. She has no cardiac hx.   Past Medical History:  Diagnosis Date   NAFLD (nonalcoholic fatty liver disease)    Past Surgical History:  Procedure Laterality Date   PILONIDAL CYST EXCISION  2000   Family History  Problem Relation Age of Onset   Stroke Mother    Cancer Neg Hx    Heart disease Neg Hx    No Known Allergies  Current Outpatient Medications:    acetaminophen (TYLENOL) 500 MG tablet, Take 500 mg by mouth every 6 (six) hours as needed., Disp: , Rfl:    cetirizine (ZYRTEC) 10 MG chewable tablet, Chew 10 mg by mouth daily., Disp: , Rfl:    ondansetron (ZOFRAN-ODT) 4 MG disintegrating tablet, Take 1 tablet (4 mg total) by mouth every 8 (eight) hours as needed for nausea or vomiting., Disp: 20 tablet, Rfl: 0   pantoprazole (PROTONIX) 40 MG tablet, Take 1 tablet (40 mg total) by mouth daily., Disp: 30 tablet, Rfl: 1  OBJECTIVE: BP 120/80 (BP  Location: Left Arm, Patient Position: Sitting, Cuff Size: Large)   Pulse 96   Temp 98 F (36.7 C) (Oral)   Ht 5\' 3"  (1.6 m)   Wt 255 lb 4 oz (115.8 kg)   SpO2 98%   BMI 45.22 kg/m  General:  well developed, well nourished, in no apparent distress Skin: no jaundice, no significant moles, warts, or growths Eyes: PERRLA, no scleral icterus Abd: BS+, S, ND, ttp in epigastric region and RUQ, neg Murphy's, McBurney's, Rovsing's, Carnett's Throat/Pharynx:  lips and gingiva without lesion; tongue and uvula midline; non-inflamed pharynx; no exudates or postnasal drainage; no icterus Lungs:  clear to auscultation, breath sounds equal bilaterally, no respiratory distress Cardio:  regular rate and rhythm, no LE edema or bruits Musculoskeletal:  symmetrical muscle groups noted without atrophy or deformity Neuro:  gait normal Psych: well oriented with normal range of affect and appropriate judgment/insight  ASSESSMENT/PLAN: NAFLD (nonalcoholic fatty liver disease)  Transaminitis - Plan: AntiMicrosomal Ab-Liver / Kidney, Anti-smooth muscle antibody, IgG, Alpha-1-antitrypsin, IBC + Ferritin, Ceruloplasmin  Abnormal EKG - Plan: ECHOCARDIOGRAM COMPLETE  Abdominal pain, unspecified abdominal location - Plan: pantoprazole (PROTONIX) 40 MG tablet  Tachycardia - Plan: TSH  Need for Tdap vaccination - Plan: Tdap vaccine greater than or equal to 7yo IM   Fib4-  1.82. needs to see GI. She will let me know if she has issues getting in . Follow up on this with the above labs. Reassurance for now. Will ck Echo. CXR from several years ago did not show cardiomegaly. EKG did show LVH. No dyspnea or concerns for ACS.  Add TSH. Discussed medication which she will decline for now. EKG not suggestive of MAT. Tdap today. F/u in 3 mo for CPE or prn. The patient voiced understanding and agreement to the plan.  I spent 45 min w pt discussing the above plans in addition to reviewing her chart on the same day of the  visit.    [place], DO 05/10/22  11:11 AM

## 2022-05-10 NOTE — Patient Instructions (Addendum)
Let me know if there are issues with getting scheduled with the [facility] GI team.   Give [place] 2-3 business days to get the results of your labs back.   Someone will reach out with the echo scheduling in the next few weeks.   Let us know if you need anything.

## 2022-05-11 ENCOUNTER — Encounter: Payer: Self-pay | Admitting: Family Medicine

## 2022-05-12 ENCOUNTER — Telehealth: Payer: Self-pay | Admitting: Family Medicine

## 2022-05-12 NOTE — Telephone Encounter (Signed)
[facility]cert called stating the patient has an Echocardiogram coming up on January 16th and per the patient's insurance a PA will be needed. She would like a call once the PA is done at [phone] ext [phone]. Please advise.

## 2022-05-15 LAB — ANTI-SMOOTH MUSCLE ANTIBODY, IGG: Actin (Smooth Muscle) Antibody (IGG): <20 U (ref <20)

## 2022-05-15 LAB — ANTI-MICROSOMAL ANTIBODY LIVER / KIDNEY: LKM1 Ab: <=20 U (ref <=20.0)

## 2022-05-15 LAB — ALPHA-1-ANTITRYPSIN: A-1 Antitrypsin, Ser: 135 mg/dL (ref 83–199)

## 2022-05-15 LAB — CERULOPLASMIN: Ceruloplasmin: 33 mg/dL (ref 18–53)

## 2022-05-16 ENCOUNTER — Encounter: Payer: Self-pay | Admitting: Family Medicine

## 2022-05-16 ENCOUNTER — Other Ambulatory Visit: Payer: Self-pay | Admitting: Family Medicine

## 2022-05-16 ENCOUNTER — Ambulatory Visit (HOSPITAL_COMMUNITY)
Admission: RE | Admit: 2022-05-16 | Discharge: 2022-05-16 | Disposition: A | Discharge status: Home / Self Care | Payer: 59 | Source: Ambulatory Visit | Attending: Family Medicine | Admitting: Family Medicine

## 2022-05-16 DIAGNOSIS — R9431 Abnormal electrocardiogram [ECG] [EKG]: Secondary | ICD-10-CM

## 2022-05-16 DIAGNOSIS — I081 Rheumatic disorders of both mitral and tricuspid valves: Secondary | ICD-10-CM | POA: Insufficient documentation

## 2022-05-16 DIAGNOSIS — R000 Tachycardia, unspecified: Secondary | ICD-10-CM

## 2022-05-16 LAB — ECHOCARDIOGRAM COMPLETE
AR max vel: 2.51 cm2
AV Area VTI: 2.25 cm2
AV Area mean vel: 2.2 cm2
AV Mean grad: 3 mmHg
AV Peak grad: 6.1 mmHg
Ao pk vel: 1.23 m/s
MV M vel: 5.34 m/s
MV Peak grad: 114.1 mmHg
S' Lateral: 5.3 cm

## 2022-05-16 NOTE — Progress Notes (Signed)
Echocardiogram 2D Echocardiogram has been performed.  Amber Stein 05/16/2022, 1:40 PM

## 2022-05-17 ENCOUNTER — Other Ambulatory Visit (HOSPITAL_COMMUNITY): Payer: Self-pay | Admitting: Internal Medicine

## 2022-05-17 DIAGNOSIS — R748 Abnormal levels of other serum enzymes: Secondary | ICD-10-CM

## 2022-05-19 ENCOUNTER — Telehealth: Payer: Self-pay | Admitting: Family Medicine

## 2022-05-19 ENCOUNTER — Ambulatory Visit: Payer: 59 | Attending: Cardiology | Admitting: Internal Medicine

## 2022-05-19 ENCOUNTER — Encounter: Payer: Self-pay | Admitting: Internal Medicine

## 2022-05-19 VITALS — BP 144/114 | HR 112 | Ht 63.0 in | Wt 271.2 lb

## 2022-05-19 DIAGNOSIS — R9431 Abnormal electrocardiogram [ECG] [EKG]: Secondary | ICD-10-CM

## 2022-05-19 DIAGNOSIS — Z01810 Encounter for preprocedural cardiovascular examination: Secondary | ICD-10-CM

## 2022-05-19 MED ORDER — ENTRESTO 24-26 MG PO TABS: 1.0000 | ORAL_TABLET | Freq: Two times a day (BID) | ORAL | 3 refills | Status: DC

## 2022-05-19 MED ORDER — CARVEDILOL 3.125 MG PO TABS: 3.1250 mg | ORAL_TABLET | Freq: Two times a day (BID) | ORAL | 3 refills | Status: DC

## 2022-05-19 NOTE — H&P (View-Only) (Signed)
Cardiology Office Note   Date:  05/19/2022   ID:  Amber Stein, DOB 10-08-1980, MRN [id number]  PCP:  [name], DO   Patient referred for evaluation of CHF   History of Present Illness: Amber Stein is a 42 y.o. female with a history abdominal pain   Started in Dec 2023  Initial evaluation by  surgery with labs showed elevated LFTs.   The pt went to ER due to continued RUQ pain  and RUQ USN showed fatty liver dz.  Labs showed a progressive increase in LFTs   EKG in ER showed LBBB   Seen by Dr Vickki Hearing on 05/10/22   LIver serologies done   All negative Echo ordered  Patient noted to be tachycardic at that visit.  She was seen by [facility] on 05/17/22.  Echo on 05/16/22 showed LV was dilated   LVEf was severely depressed  with akinesis of the septum, inferolateral, distal anterolateral and apical walls    RVEF also severely depressed  There was severe MR directed posterior into LA    The pt says she starte getting SOB in early December    May have had a stomach virus.  Denies CP  NO dizziness or syncnpe   Has had some swelling in legs over the past couple weeks  Uses compression hose.   ALso notes using an extra pillow to sleep  NO PND        Current Meds  Medication Sig   cetirizine (ZYRTEC) 10 MG chewable tablet Chew 10 mg by mouth daily.   ibuprofen (ADVIL) 200 MG tablet Take 400 mg by mouth every 6 (six) hours as needed for mild pain or moderate pain.   ondansetron (ZOFRAN-ODT) 4 MG disintegrating tablet Take 1 tablet (4 mg total) by mouth every 8 (eight) hours as needed for nausea or vomiting.   pantoprazole (PROTONIX) 40 MG tablet Take 1 tablet (40 mg total) by mouth daily.   [DISCONTINUED] acetaminophen (TYLENOL) 500 MG tablet Take 500 mg by mouth every 6 (six) hours as needed.     Allergies:   Patient has no known allergies.   Past Medical History:  Diagnosis Date   NAFLD (nonalcoholic fatty liver disease)     Past Surgical History:   Procedure Laterality Date   PILONIDAL CYST EXCISION  2000     Social History:  The patient  reports that she has never smoked. She has never used smokeless tobacco. She reports that she does not drink alcohol and does not use drugs.   Family History:  The patient's family history includes Stroke in her mother.    ROS:  Please see the history of present illness. All other systems are reviewed and  Negative to the above problem except as noted.    PHYSICAL EXAM: VS:  BP (!) 144/114   Pulse (!) 112   Ht 5\' 3"  (1.6 m)   Wt 271 lb 3.2 oz (123 kg)   SpO2 97%   BMI 48.04 kg/m   GEN:  MOrbidly obese 42 yo in no acute distress  HEENT: normal  Neck: Neck is full  Difficult to assess JVP    Cardiac: RRR   Gr I-II/VI systolic murmur apex   1+ LE edema  Respiratory:  clear to auscultation bilaterally,  GI:  Obese  soft, nontender, nondistended, + BS  No hepatomegaly  MS: no deformity Moving all extremities   Skin: warm and dry, no rash Neuro:  Strength and sensation  are intact Psych: euthymic mood, full affect   EKG:  EKG is not ordered today.  On 05/09/22  ST 105 bpm   LBBB  Echo  05/16/22    1. Left ventricular ejection fraction, by estimation, is 20 to 25%. Left  ventricular ejection fraction by 3D volume is 24 %. The left ventricle has  severely decreased function. The left ventricle demonstrates global  hypokinesis. Left ventricular  diastolic parameters are indeterminate.   2. Right ventricular systolic function is normal. The right ventricular  size is normal. There is moderately elevated pulmonary artery systolic  pressure.   3. Left atrial size was mildly dilated.   4. Right atrial size was mildly dilated.   5. The mitral valve is normal in structure. Severe secondary mitral valve  regurgitation. No evidence of mitral stenosis.   6. Tricuspid valve regurgitation is moderate.   7. The aortic valve is normal in structure. Aortic valve regurgitation is  not visualized. No  aortic stenosis is present.   8. The inferior vena cava is dilated in size with >50% respiratory  variability, suggesting right atrial pressure of 8 mmHg.   Lipid Panel    Component Value Date/Time   CHOL 200 (H) 02/28/2019 1022   TRIG 153 (H) 02/28/2019 1022   HDL 37 (L) 02/28/2019 1022   CHOLHDL 5.4 (H) 02/28/2019 1022   LDLCALC 135 (H) 02/28/2019 1022      Wt Readings from Last 3 Encounters:  05/19/22 271 lb 3.2 oz (123 kg)  05/10/22 255 lb 4 oz (115.8 kg)  05/09/22 254 lb (115.2 kg)      ASSESSMENT AND PLAN:  1  HFrEF   PT with symptoms of SOB since Dec.  EKG with LBBB   Echo with severe LV and RV dysfunciton with some regional wall differences    SHe is tachycardic today suggesting some decompensation. VOlume is up some though exam is difficult due to patient's body habitus I wonder if LFT changes are a reflection of CHF BUt she is hypertensive today, not hypotensive which goes against this  I have reviewed with patient   Need to evaluate further   Would recomm R and L heart cath to define pressures/anatomy   RIsks / benefits described   Patient understands and agrees to proceed.   Plan for next with with D McLEan    WIll start carvedilol 3.125 bid and Entresto 24/26 bid    Before adding diuretic will check BMET and BNP today   2  HTN   As noted above will add carvedilol and ENtresto  4.  Mitral regurgitation.    WIll  need furhter evaluation once volume status and BP improved      5   LBBB   Follow   6  LFTs   WIll need to be followed   ? Passive congestion in setting of CHF Pt does have diffuse fatty liver dz  7  LIpids   WIll need to get labs      8 MEtabolics  Pt with significant metabolic dysfunciton   WIll need to review on future visits        Current medicines are reviewed at length with the patient today.  The patient does not have concerns regarding medicines.  Signed, Dorris Carnes, MD  05/19/2022 3:25 PM    [facility] [place], [place],   [address] Phone: [phone]; Fax: [phone]

## 2022-05-19 NOTE — Telephone Encounter (Signed)
In Providers folder

## 2022-05-19 NOTE — Progress Notes (Signed)
Cardiology Office Note   Date:  05/19/2022   ID:  Amber Stein, DOB 10-08-1980, MRN [id number]  PCP:  [name], DO   Patient referred for evaluation of CHF   History of Present Illness: Analy Stein is a 42 y.o. female with a history abdominal pain   Started in Dec 2023  Initial evaluation by  surgery with labs showed elevated LFTs.   The pt went to ER due to continued RUQ pain  and RUQ USN showed fatty liver dz.  Labs showed a progressive increase in LFTs   EKG in ER showed LBBB   Seen by Dr Vickki Hearing on 05/10/22   LIver serologies done   All negative Echo ordered  Patient noted to be tachycardic at that visit.  She was seen by [facility] on 05/17/22.  Echo on 05/16/22 showed LV was dilated   LVEf was severely depressed  with akinesis of the septum, inferolateral, distal anterolateral and apical walls    RVEF also severely depressed  There was severe MR directed posterior into LA    The pt says she starte getting SOB in early December    May have had a stomach virus.  Denies CP  NO dizziness or syncnpe   Has had some swelling in legs over the past couple weeks  Uses compression hose.   ALso notes using an extra pillow to sleep  NO PND        Current Meds  Medication Sig   cetirizine (ZYRTEC) 10 MG chewable tablet Chew 10 mg by mouth daily.   ibuprofen (ADVIL) 200 MG tablet Take 400 mg by mouth every 6 (six) hours as needed for mild pain or moderate pain.   ondansetron (ZOFRAN-ODT) 4 MG disintegrating tablet Take 1 tablet (4 mg total) by mouth every 8 (eight) hours as needed for nausea or vomiting.   pantoprazole (PROTONIX) 40 MG tablet Take 1 tablet (40 mg total) by mouth daily.   [DISCONTINUED] acetaminophen (TYLENOL) 500 MG tablet Take 500 mg by mouth every 6 (six) hours as needed.     Allergies:   Patient has no known allergies.   Past Medical History:  Diagnosis Date   NAFLD (nonalcoholic fatty liver disease)     Past Surgical History:   Procedure Laterality Date   PILONIDAL CYST EXCISION  2000     Social History:  The patient  reports that she has never smoked. She has never used smokeless tobacco. She reports that she does not drink alcohol and does not use drugs.   Family History:  The patient's family history includes Stroke in her mother.    ROS:  Please see the history of present illness. All other systems are reviewed and  Negative to the above problem except as noted.    PHYSICAL EXAM: VS:  BP (!) 144/114   Pulse (!) 112   Ht 5\' 3"  (1.6 m)   Wt 271 lb 3.2 oz (123 kg)   SpO2 97%   BMI 48.04 kg/m   GEN:  MOrbidly obese 42 yo in no acute distress  HEENT: normal  Neck: Neck is full  Difficult to assess JVP    Cardiac: RRR   Gr I-II/VI systolic murmur apex   1+ LE edema  Respiratory:  clear to auscultation bilaterally,  GI:  Obese  soft, nontender, nondistended, + BS  No hepatomegaly  MS: no deformity Moving all extremities   Skin: warm and dry, no rash Neuro:  Strength and sensation  are intact Psych: euthymic mood, full affect   EKG:  EKG is not ordered today.  On 05/09/22  ST 105 bpm   LBBB  Echo  05/16/22    1. Left ventricular ejection fraction, by estimation, is 20 to 25%. Left  ventricular ejection fraction by 3D volume is 24 %. The left ventricle has  severely decreased function. The left ventricle demonstrates global  hypokinesis. Left ventricular  diastolic parameters are indeterminate.   2. Right ventricular systolic function is normal. The right ventricular  size is normal. There is moderately elevated pulmonary artery systolic  pressure.   3. Left atrial size was mildly dilated.   4. Right atrial size was mildly dilated.   5. The mitral valve is normal in structure. Severe secondary mitral valve  regurgitation. No evidence of mitral stenosis.   6. Tricuspid valve regurgitation is moderate.   7. The aortic valve is normal in structure. Aortic valve regurgitation is  not visualized. No  aortic stenosis is present.   8. The inferior vena cava is dilated in size with >50% respiratory  variability, suggesting right atrial pressure of 8 mmHg.   Lipid Panel    Component Value Date/Time   CHOL 200 (H) 02/28/2019 1022   TRIG 153 (H) 02/28/2019 1022   HDL 37 (L) 02/28/2019 1022   CHOLHDL 5.4 (H) 02/28/2019 1022   LDLCALC 135 (H) 02/28/2019 1022      Wt Readings from Last 3 Encounters:  05/19/22 271 lb 3.2 oz (123 kg)  05/10/22 255 lb 4 oz (115.8 kg)  05/09/22 254 lb (115.2 kg)      ASSESSMENT AND PLAN:  1  HFrEF   PT with symptoms of SOB since Dec.  EKG with LBBB   Echo with severe LV and RV dysfunciton with some regional wall differences    SHe is tachycardic today suggesting some decompensation. VOlume is up some though exam is difficult due to patient's body habitus I wonder if LFT changes are a reflection of CHF BUt she is hypertensive today, not hypotensive which goes against this  I have reviewed with patient   Need to evaluate further   Would recomm R and L heart cath to define pressures/anatomy   RIsks / benefits described   Patient understands and agrees to proceed.   Plan for next with with D McLEan    WIll start carvedilol 3.125 bid and Entresto 24/26 bid    Before adding diuretic will check BMET and BNP today   2  HTN   As noted above will add carvedilol and ENtresto  4.  Mitral regurgitation.    WIll  need furhter evaluation once volume status and BP improved      5   LBBB   Follow   6  LFTs   WIll need to be followed   ? Passive congestion in setting of CHF Pt does have diffuse fatty liver dz  7  LIpids   WIll need to get labs      8 MEtabolics  Pt with significant metabolic dysfunciton   WIll need to review on future visits        Current medicines are reviewed at length with the patient today.  The patient does not have concerns regarding medicines.  Signed, Eligah Anello, MD  05/19/2022 3:25 PM    [facility] [address], [place], [place]  [address] Phone: [phone]; Fax: [phone]    

## 2022-05-19 NOTE — Patient Instructions (Addendum)
Medication Instructions:  Start Entresto 24/26 twice day Start Carvedilol mg twice a day   Start these meds one day apart from eachother   *If you need a refill on your cardiac medications before your next appointment, please call your pharmacy*   Lab Work: PRO BNP and BMET today   If you have labs (blood work) drawn today and your tests are completely normal, you will receive your results only by: [place] (if you have MyChart) OR A paper copy in the mail If you have any lab test that is abnormal or we need to change your treatment, we will call you to review the results.   Testing/Procedures:        Cardiac/Peripheral Catheterization   You are scheduled for a Cardiac Catheterization on Friday 05/26/22 at 12 noon. With Dr Aundra Dubin    1. Please arrive at the Main Entrance A at [facility]: [address] [place], [place] [address].        (This time is two hours before your procedure to ensure your preparation). Free valet parking service is available. You will check in at ADMITTING. The support person will be asked to wait in the waiting room.  It is OK to have someone drop you off and come back when you are ready to be discharged.        Special note: Every effort is made to have your procedure done on time. Please understand that emergencies sometimes delay scheduled procedures.   . 2. Diet: Do not eat solid foods after midnight.  You may have clear liquids until 5 AM the day of the procedure.  3. Labs: Done   4. Medication instructions in preparation for your procedure:   Contrast Allergy: No  On the morning of your procedure, take Aspirin 81 mg and any morning medicines NOT listed above.  You may use sips of water.  5. Plan to go home the same day, you will only stay overnight if medically necessary. 6. You MUST have a responsible adult to drive you home. 7. An adult MUST be with you the first 24 hours after you arrive home. 8. Bring a current list of  your medications, and the last time and date medication taken. 9. Bring ID and current insurance cards. 10.Please wear clothes that are easy to get on and off and wear slip-on shoes.  Thank you for allowing [place] to care for you!   -- [facility]     Follow-Up: At [facility], you and your health needs are our priority.  As part of our continuing mission to provide you with exceptional heart care, we have created designated Provider Care Teams.  These Care Teams include your primary Cardiologist (physician) and Advanced Practice Providers (APPs -  Physician Assistants and Nurse Practitioners) who all work together to provide you with the care you need, when you need it.  We recommend signing up for the patient portal called "MyChart".  Sign up information is provided on this After Visit Summary.  MyChart is used to connect with patients for Virtual Visits (Telemedicine).  Patients are able to view lab/test results, encounter notes, upcoming appointments, etc.  Non-urgent messages can be sent to your provider as well.   To learn more about what you can do with MyChart, go to [email or web address].

## 2022-05-19 NOTE — Telephone Encounter (Signed)
Forms were faxed into front office  Placed in wendling bin up front

## 2022-05-20 LAB — BASIC METABOLIC PANEL
BUN/Creatinine Ratio: 17 (ref 9–23)
BUN: 16 mg/dL (ref 6–20)
CO2: 21 mmol/L (ref 20–29)
Calcium: 9.6 mg/dL (ref 8.7–10.2)
Chloride: 103 mmol/L (ref 96–106)
Creatinine, Ser: 0.96 mg/dL (ref 0.57–1.00)
Glucose: 125 mg/dL — ABNORMAL HIGH (ref 70–99)
Potassium: 4.5 mmol/L (ref 3.5–5.2)
Sodium: 138 mmol/L (ref 134–144)
eGFR: 77 mL/min/{1.73_m2} (ref >59)

## 2022-05-20 LAB — PRO B NATRIURETIC PEPTIDE: NT-Pro BNP: 3243 pg/mL — ABNORMAL HIGH (ref 0–130)

## 2022-05-22 ENCOUNTER — Telehealth: Payer: Self-pay | Admitting: Family Medicine

## 2022-05-22 ENCOUNTER — Encounter: Payer: Self-pay | Admitting: Internal Medicine

## 2022-05-22 DIAGNOSIS — R7989 Other specified abnormal findings of blood chemistry: Secondary | ICD-10-CM

## 2022-05-22 DIAGNOSIS — Z79899 Other long term (current) drug therapy: Secondary | ICD-10-CM

## 2022-05-22 NOTE — Telephone Encounter (Signed)
Patient was seen in 05/19/22 by Cardiology They did do labs/she has not heard back yet from them. She stated she is having swelling and would like to be put on a diuretic.

## 2022-05-22 NOTE — Telephone Encounter (Signed)
I spoke with the Amber Stein and advised her that I will call her once Dr Harrington Challenger plans her recommendations after reviewing her results.

## 2022-05-22 NOTE — Telephone Encounter (Signed)
Called the patient informed of PCP instructions 

## 2022-05-23 ENCOUNTER — Telehealth (HOSPITAL_COMMUNITY): Payer: Self-pay

## 2022-05-23 MED ORDER — POTASSIUM CHLORIDE ER 10 MEQ PO TBCR: 10.0000 meq | EXTENDED_RELEASE_TABLET | Freq: Every day | ORAL | 3 refills | Status: DC

## 2022-05-23 MED ORDER — FUROSEMIDE 40 MG PO TABS: 40.0000 mg | ORAL_TABLET | Freq: Every day | ORAL | 3 refills | Status: DC

## 2022-05-23 NOTE — Telephone Encounter (Signed)
Spoke with patient regarding rescheduling of cardiac catheterization procedure due to MD scheduling conflict. Rescheduled from Friday 1/26 to Thursday 1/25 @ 1:30pm with Dr. Aundra Dubin. Confirmed for patient to take AM medications and 81mg  ASA. No blood thinner noted on medication list. Gave directions and parking instructions to main entrance. Instructed patient to arrive 2 hours prior to procedure and needs a reliable driver/supervision for 24 hours post-procedure.   Confirmed new date/time and answered all questions prior to ending call.    Dr. Aundra Dubin aware of time/date change.

## 2022-05-23 NOTE — Addendum Note (Signed)
Addended by: Stephani Police on: 05/23/2022 02:56 PM   Modules accepted: Orders

## 2022-05-23 NOTE — Telephone Encounter (Signed)
Electrolytes and kidney are OK Fluid is up I would recomm adding lasix 40 mg per day with 10 KCL F/U BMET and BNP in 10 days.

## 2022-05-24 ENCOUNTER — Telehealth: Payer: Self-pay | Admitting: *Deleted

## 2022-05-24 ENCOUNTER — Encounter (HOSPITAL_COMMUNITY)
Admission: RE | Admit: 2022-05-24 | Discharge: 2022-05-24 | Disposition: A | Discharge status: Home / Self Care | Payer: 59 | Source: Ambulatory Visit | Attending: Internal Medicine | Admitting: Internal Medicine

## 2022-05-24 DIAGNOSIS — R748 Abnormal levels of other serum enzymes: Secondary | ICD-10-CM | POA: Insufficient documentation

## 2022-05-24 MED ORDER — TECHNETIUM TC 99M MEBROFENIN IV KIT
5.2000 | PACK | Freq: Once | INTRAVENOUS | Status: AC | PRN
  Administered 2022-05-24: 5.2 via INTRAVENOUS

## 2022-05-24 NOTE — Telephone Encounter (Signed)
Cardiac Catheterization scheduled at [facility] for: Thursday May 25, 2022 1:30 PM Arrival time and place: [facility] Main Entrance A at: 11:30 AM  Nothing to eat after midnight prior to procedure, clear liquids until 5 AM day of procedure.  Medication instructions: -Hold:  Lasix/KCl-AM of procedure -Except hold medications usual morning medications can be taken with sips of water including aspirin 81 mg.  Confirmed patient has responsible adult to drive home post procedure and be with patient first 24 hours after arriving home.  Patient reports no new symptoms concerning for COVID-19 in the past 10 days.  Reviewed procedure instructions with patient.

## 2022-05-25 ENCOUNTER — Encounter (HOSPITAL_COMMUNITY): Payer: Self-pay | Admitting: Cardiology

## 2022-05-25 ENCOUNTER — Ambulatory Visit (HOSPITAL_COMMUNITY)
Admission: RE | Admit: 2022-05-25 | Discharge: 2022-05-25 | Disposition: A | Discharge status: Home / Self Care | Payer: 59 | Attending: Cardiology | Admitting: Cardiology

## 2022-05-25 ENCOUNTER — Other Ambulatory Visit: Payer: 59

## 2022-05-25 ENCOUNTER — Encounter (HOSPITAL_COMMUNITY)
Admission: RE | Disposition: A | Discharge status: Home / Self Care | Payer: Self-pay | Source: Home / Self Care | Attending: Cardiology

## 2022-05-25 DIAGNOSIS — I110 Hypertensive heart disease with heart failure: Secondary | ICD-10-CM | POA: Insufficient documentation

## 2022-05-25 DIAGNOSIS — I509 Heart failure, unspecified: Secondary | ICD-10-CM

## 2022-05-25 DIAGNOSIS — I340 Nonrheumatic mitral (valve) insufficiency: Secondary | ICD-10-CM | POA: Insufficient documentation

## 2022-05-25 DIAGNOSIS — Z79899 Other long term (current) drug therapy: Secondary | ICD-10-CM | POA: Diagnosis not present

## 2022-05-25 DIAGNOSIS — Z01810 Encounter for preprocedural cardiovascular examination: Secondary | ICD-10-CM

## 2022-05-25 DIAGNOSIS — I5022 Chronic systolic (congestive) heart failure: Secondary | ICD-10-CM | POA: Diagnosis not present

## 2022-05-25 DIAGNOSIS — I447 Left bundle-branch block, unspecified: Secondary | ICD-10-CM | POA: Insufficient documentation

## 2022-05-25 DIAGNOSIS — I272 Pulmonary hypertension, unspecified: Secondary | ICD-10-CM | POA: Insufficient documentation

## 2022-05-25 HISTORY — PX: RIGHT/LEFT HEART CATH AND CORONARY ANGIOGRAPHY: CATH118266

## 2022-05-25 LAB — POCT I-STAT EG7
Acid-Base Excess: 1 mmol/L (ref 0.0–2.0)
Acid-base deficit: 2 mmol/L (ref 0.0–2.0)
Bicarbonate: 23.5 mmol/L (ref 20.0–28.0)
Bicarbonate: 26.8 mmol/L (ref 20.0–28.0)
Calcium, Ion: 0.98 mmol/L — ABNORMAL LOW (ref 1.15–1.40)
Calcium, Ion: 1.16 mmol/L (ref 1.15–1.40)
HCT: 40 % (ref 36.0–46.0)
HCT: 42 % (ref 36.0–46.0)
Hemoglobin: 13.6 g/dL (ref 12.0–15.0)
Hemoglobin: 14.3 g/dL (ref 12.0–15.0)
O2 Saturation: 67 %
O2 Saturation: 69 %
Potassium: 3.1 mmol/L — ABNORMAL LOW (ref 3.5–5.1)
Potassium: 3.5 mmol/L (ref 3.5–5.1)
Sodium: 138 mmol/L (ref 135–145)
Sodium: 141 mmol/L (ref 135–145)
TCO2: 25 mmol/L (ref 22–32)
TCO2: 28 mmol/L (ref 22–32)
pCO2, Ven: 42.2 mmHg — ABNORMAL LOW (ref 44–60)
pCO2, Ven: 44.2 mmHg (ref 44–60)
pH, Ven: 7.354 (ref 7.25–7.43)
pH, Ven: 7.391 (ref 7.25–7.43)
pO2, Ven: 37 mmHg (ref 32–45)
pO2, Ven: 37 mmHg (ref 32–45)

## 2022-05-25 LAB — PREGNANCY, URINE: Preg Test, Ur: NEGATIVE

## 2022-05-25 SURGERY — RIGHT/LEFT HEART CATH AND CORONARY ANGIOGRAPHY
Anesthesia: LOCAL

## 2022-05-25 MED ORDER — SODIUM CHLORIDE 0.9% FLUSH: 3.0000 mL | INTRAVENOUS | Status: DC | PRN

## 2022-05-25 MED ORDER — HEPARIN SODIUM (PORCINE) 1000 UNIT/ML IJ SOLN
INTRAMUSCULAR | Status: DC | PRN
  Administered 2022-05-25: 6000 [IU] via INTRAVENOUS

## 2022-05-25 MED ORDER — SODIUM CHLORIDE 0.9% FLUSH: 3.0000 mL | Freq: Two times a day (BID) | INTRAVENOUS | Status: DC

## 2022-05-25 MED ORDER — MIDAZOLAM HCL 2 MG/2ML IJ SOLN
INTRAMUSCULAR | Status: DC | PRN
  Administered 2022-05-25 (×2): 1 mg via INTRAVENOUS

## 2022-05-25 MED ORDER — HEPARIN (PORCINE) IN NACL 1000-0.9 UT/500ML-% IV SOLN
INTRAVENOUS | Status: DC | PRN
  Administered 2022-05-25 (×2): 500 mL

## 2022-05-25 MED ORDER — LABETALOL HCL 5 MG/ML IV SOLN: 10.0000 mg | INTRAVENOUS | Status: DC | PRN

## 2022-05-25 MED ORDER — FENTANYL CITRATE (PF) 100 MCG/2ML IJ SOLN
INTRAMUSCULAR | Status: AC
  Filled 2022-05-25: qty 2

## 2022-05-25 MED ORDER — IOHEXOL 350 MG/ML SOLN
INTRAVENOUS | Status: DC | PRN
  Administered 2022-05-25: 40 mL

## 2022-05-25 MED ORDER — ONDANSETRON HCL 4 MG/2ML IJ SOLN: 4.0000 mg | Freq: Four times a day (QID) | INTRAMUSCULAR | Status: DC | PRN

## 2022-05-25 MED ORDER — VERAPAMIL HCL 2.5 MG/ML IV SOLN: INTRAVENOUS | Status: DC | PRN

## 2022-05-25 MED ORDER — FENTANYL CITRATE (PF) 100 MCG/2ML IJ SOLN
INTRAMUSCULAR | Status: DC | PRN
  Administered 2022-05-25 (×2): 25 ug via INTRAVENOUS

## 2022-05-25 MED ORDER — HEPARIN SODIUM (PORCINE) 1000 UNIT/ML IJ SOLN
INTRAMUSCULAR | Status: AC
  Filled 2022-05-25: qty 10

## 2022-05-25 MED ORDER — LIDOCAINE HCL (PF) 1 % IJ SOLN
INTRAMUSCULAR | Status: DC | PRN
  Administered 2022-05-25 (×2): 2 mL

## 2022-05-25 MED ORDER — LIDOCAINE HCL (PF) 1 % IJ SOLN
INTRAMUSCULAR | Status: AC
  Filled 2022-05-25: qty 30

## 2022-05-25 MED ORDER — VERAPAMIL HCL 2.5 MG/ML IV SOLN
INTRAVENOUS | Status: AC
  Filled 2022-05-25: qty 2

## 2022-05-25 MED ORDER — ASPIRIN 81 MG PO CHEW: 81.0000 mg | CHEWABLE_TABLET | ORAL | Status: DC

## 2022-05-25 MED ORDER — FUROSEMIDE 40 MG PO TABS: 40.0000 mg | ORAL_TABLET | Freq: Two times a day (BID) | ORAL | 3 refills | Status: DC

## 2022-05-25 MED ORDER — HYDRALAZINE HCL 20 MG/ML IJ SOLN: 10.0000 mg | INTRAMUSCULAR | Status: DC | PRN

## 2022-05-25 MED ORDER — SODIUM CHLORIDE 0.9 % IV SOLN: 250.0000 mL | INTRAVENOUS | Status: DC | PRN

## 2022-05-25 MED ORDER — MIDAZOLAM HCL 2 MG/2ML IJ SOLN
INTRAMUSCULAR | Status: AC
  Filled 2022-05-25: qty 2

## 2022-05-25 MED ORDER — ACETAMINOPHEN 325 MG PO TABS: 650.0000 mg | ORAL_TABLET | ORAL | Status: DC | PRN

## 2022-05-25 MED ORDER — SODIUM CHLORIDE 0.9 % IV SOLN: INTRAVENOUS | Status: DC

## 2022-05-25 MED ORDER — POTASSIUM CHLORIDE ER 10 MEQ PO TBCR: 40.0000 meq | EXTENDED_RELEASE_TABLET | Freq: Every day | ORAL | 3 refills | Status: DC

## 2022-05-25 MED ORDER — HEPARIN (PORCINE) IN NACL 1000-0.9 UT/500ML-% IV SOLN
INTRAVENOUS | Status: AC
  Filled 2022-05-25: qty 1000

## 2022-05-25 SURGICAL SUPPLY — 10 items
CATH 5FR JL3.5 JR4 ANG PIG MP (CATHETERS) IMPLANT
CATH BALLN WEDGE 5F 110CM (CATHETERS) IMPLANT
DEVICE RAD COMP TR BAND LRG (VASCULAR PRODUCTS) IMPLANT
GLIDESHEATH SLEND SS 6F .021 (SHEATH) IMPLANT
GUIDEWIRE INQWIRE 1.5J.035X260 (WIRE) IMPLANT
INQWIRE 1.5J .035X260CM (WIRE) ×1
KIT HEART LEFT (KITS) ×1 IMPLANT
PACK CARDIAC CATHETERIZATION (CUSTOM PROCEDURE TRAY) ×1 IMPLANT
SHEATH GLIDE SLENDER 4/5FR (SHEATH) IMPLANT
TRANSDUCER W/STOPCOCK (MISCELLANEOUS) ×1 IMPLANT

## 2022-05-25 NOTE — Interval H&P Note (Signed)
History and Physical Interval Note:  05/25/2022 2:17 PM  Amber Stein  has presented today for surgery, with the diagnosis of low EF.  The various methods of treatment have been discussed with the patient and family. After consideration of risks, benefits and other options for treatment, the patient has consented to  Procedure(s): RIGHT/LEFT HEART CATH AND CORONARY ANGIOGRAPHY (N/A) as a surgical intervention.  The patient's history has been reviewed, patient examined, no change in status, stable for surgery.  I have reviewed the patient's chart and labs.  Questions were answered to the patient's satisfaction.     Oiva Dibari [employer]

## 2022-05-25 NOTE — Discharge Instructions (Signed)
1. Increase furosemide (Lasix) to 40 mg twice a day.  2. Increase potassium to 40 mEq daily.  3. Will arrange followup with me in CHF clinic.

## 2022-05-25 NOTE — Progress Notes (Signed)
TR BAND REMOVAL  LOCATION:    right radial  DEFLATED PER PROTOCOL:    Yes.    TIME BAND OFF / DRESSING APPLIED:    1720 Gauze dressing applied   SITE UPON ARRIVAL:    Level 0  SITE AFTER BAND REMOVAL:    Level 0  CIRCULATION SENSATION AND MOVEMENT:    Within Normal Limits   Yes.    COMMENTS:   no issues noted

## 2022-05-26 ENCOUNTER — Ambulatory Visit: Payer: 59 | Admitting: Cardiovascular Disease

## 2022-05-31 ENCOUNTER — Ambulatory Visit: Payer: 59 | Admitting: Cardiology

## 2022-06-02 ENCOUNTER — Ambulatory Visit: Payer: 59 | Attending: Internal Medicine

## 2022-06-02 DIAGNOSIS — R7989 Other specified abnormal findings of blood chemistry: Secondary | ICD-10-CM

## 2022-06-02 DIAGNOSIS — Z79899 Other long term (current) drug therapy: Secondary | ICD-10-CM

## 2022-06-03 LAB — PRO B NATRIURETIC PEPTIDE: NT-Pro BNP: 1519 pg/mL — ABNORMAL HIGH (ref 0–130)

## 2022-06-03 LAB — BASIC METABOLIC PANEL
BUN/Creatinine Ratio: 17 (ref 9–23)
BUN: 16 mg/dL (ref 6–20)
CO2: 26 mmol/L (ref 20–29)
Calcium: 9.1 mg/dL (ref 8.7–10.2)
Chloride: 100 mmol/L (ref 96–106)
Creatinine, Ser: 0.93 mg/dL (ref 0.57–1.00)
Glucose: 97 mg/dL (ref 70–99)
Potassium: 3.7 mmol/L (ref 3.5–5.2)
Sodium: 143 mmol/L (ref 134–144)
eGFR: 80 mL/min/{1.73_m2} (ref >59)

## 2022-06-05 ENCOUNTER — Telehealth: Payer: Self-pay

## 2022-06-05 DIAGNOSIS — R7989 Other specified abnormal findings of blood chemistry: Secondary | ICD-10-CM

## 2022-06-05 DIAGNOSIS — Z79899 Other long term (current) drug therapy: Secondary | ICD-10-CM

## 2022-06-05 MED ORDER — FUROSEMIDE 40 MG PO TABS: ORAL_TABLET | ORAL | 3 refills | Status: DC

## 2022-06-05 MED ORDER — POTASSIUM CHLORIDE CRYS ER 20 MEQ PO TBCR: EXTENDED_RELEASE_TABLET | ORAL | 3 refills | Status: DC

## 2022-06-05 NOTE — Telephone Encounter (Signed)
Pt advised her lab results and she is seeing Dr Aundra Dubin 06/08/22... labs 06/15/22.

## 2022-06-05 NOTE — Telephone Encounter (Signed)
-----   Message from Fay Records, MD sent at 06/04/2022  3:13 PM EST ----- Electrolytes and kidney function are OK FLuid is better but still up I would recomm 1  Increae lasix to 80 mg am, 40 mg pm 2  Switch KCL to 20 meq tabs   Take 2 in am and 1 in PM Follow up BMET and BNP in 10 days

## 2022-06-07 NOTE — Telephone Encounter (Signed)
Spoke to patient.   She thinks the FMLA papers has been faxed to our office, but unsure of what number they were faxed to. Aware that I am not sure we have received them, and that there is a $29 fee to complete. Pt is seeing Aundra Dubin tomorrow and I advised her to discuss having his office complete needed paperwork.  Pt agreeable to this and was going to discuss this with him anyway. She will let us know if she needs [place] to instead after mtg w/ Aundra Dubin tomorrow. Aware I am sending her our fax number via mychart in case. Pt appreciates me call and agreeable to plan.

## 2022-06-08 ENCOUNTER — Ambulatory Visit (HOSPITAL_COMMUNITY)
Admission: RE | Admit: 2022-06-08 | Discharge: 2022-06-08 | Disposition: A | Discharge status: Home / Self Care | Payer: 59 | Source: Ambulatory Visit | Attending: Cardiology | Admitting: Cardiology

## 2022-06-08 ENCOUNTER — Encounter (HOSPITAL_COMMUNITY): Payer: Self-pay | Admitting: Cardiology

## 2022-06-08 ENCOUNTER — Other Ambulatory Visit (HOSPITAL_COMMUNITY): Payer: Self-pay

## 2022-06-08 VITALS — BP 104/64 | HR 96 | Wt 234.0 lb

## 2022-06-08 DIAGNOSIS — I340 Nonrheumatic mitral (valve) insufficiency: Secondary | ICD-10-CM | POA: Insufficient documentation

## 2022-06-08 DIAGNOSIS — K761 Chronic passive congestion of liver: Secondary | ICD-10-CM | POA: Diagnosis not present

## 2022-06-08 DIAGNOSIS — K760 Fatty (change of) liver, not elsewhere classified: Secondary | ICD-10-CM | POA: Diagnosis not present

## 2022-06-08 DIAGNOSIS — Z79899 Other long term (current) drug therapy: Secondary | ICD-10-CM | POA: Insufficient documentation

## 2022-06-08 DIAGNOSIS — I5022 Chronic systolic (congestive) heart failure: Secondary | ICD-10-CM | POA: Diagnosis present

## 2022-06-08 DIAGNOSIS — I447 Left bundle-branch block, unspecified: Secondary | ICD-10-CM | POA: Insufficient documentation

## 2022-06-08 DIAGNOSIS — I428 Other cardiomyopathies: Secondary | ICD-10-CM | POA: Diagnosis not present

## 2022-06-08 LAB — COMPREHENSIVE METABOLIC PANEL WITH GFR
ALT: 50 U/L — ABNORMAL HIGH (ref 0–44)
AST: 42 U/L — ABNORMAL HIGH (ref 15–41)
Albumin: 3.9 g/dL (ref 3.5–5.0)
Alkaline Phosphatase: 69 U/L (ref 38–126)
Anion gap: 10 (ref 5–15)
BUN: 23 mg/dL — ABNORMAL HIGH (ref 6–20)
CO2: 25 mmol/L (ref 22–32)
Calcium: 9.1 mg/dL (ref 8.9–10.3)
Chloride: 102 mmol/L (ref 98–111)
Creatinine, Ser: 0.87 mg/dL (ref 0.44–1.00)
GFR, Estimated: >60 mL/min
Glucose, Bld: 125 mg/dL — ABNORMAL HIGH (ref 70–99)
Potassium: 4.4 mmol/L (ref 3.5–5.1)
Sodium: 137 mmol/L (ref 135–145)
Total Bilirubin: 0.7 mg/dL (ref 0.3–1.2)
Total Protein: 6.9 g/dL (ref 6.5–8.1)

## 2022-06-08 LAB — BRAIN NATRIURETIC PEPTIDE: B Natriuretic Peptide: 356.2 pg/mL — ABNORMAL HIGH (ref 0.0–100.0)

## 2022-06-08 LAB — CBC
HCT: 48.6 % — ABNORMAL HIGH (ref 36.0–46.0)
Hemoglobin: 15.9 g/dL — ABNORMAL HIGH (ref 12.0–15.0)
MCH: 27.8 pg (ref 26.0–34.0)
MCHC: 32.7 g/dL (ref 30.0–36.0)
MCV: 85.1 fL (ref 80.0–100.0)
Platelets: 272 10*3/uL (ref 150–400)
RBC: 5.71 MIL/uL — ABNORMAL HIGH (ref 3.87–5.11)
RDW: 14.3 % (ref 11.5–15.5)
WBC: 8.6 10*3/uL (ref 4.0–10.5)
nRBC: 0 % (ref 0.0–0.2)

## 2022-06-08 LAB — TSH: TSH: 2.964 u[IU]/mL (ref 0.350–4.500)

## 2022-06-08 MED ORDER — DAPAGLIFLOZIN PROPANEDIOL 10 MG PO TABS: 10.0000 mg | ORAL_TABLET | Freq: Every day | ORAL | 11 refills | Status: DC

## 2022-06-08 MED ORDER — SPIRONOLACTONE 25 MG PO TABS: 12.5000 mg | ORAL_TABLET | Freq: Every day | ORAL | 3 refills | Status: DC

## 2022-06-08 NOTE — Progress Notes (Addendum)
PCP: [name], DO Cardiology: Dr. Harrington Challenger HF Cardiology: Dr. Aundra Dubin  42 y.o. with history of cardiomyopathy was referred by Dr. Harrington Challenger for evaluation of CHF.  In 12/23, patient developed abdominal pain, nausea, vomiting, and diarrhea. RUQ US showed fatty liver.  She does not drink ETOH.  She was thought to have NAFLD.  She also noted the development of significant exertional dyspnea with most activities, even just walking around the house.  Echo was done in 1/24 showing EF 20-25%, severe functional MR, mild RV dysfunction. ECG showed LBBB.  She was started on Lasix for diuresis.  RHC/LHC in 1/24 showed no CAD, elevated filling pressures and low but not markedly low cardiac output.  Lasix was increased, now she is taking 80 qam/40 qpm.    Patient's abdominal pain has resolved.  Breathing is much better, no longer short of breath walking around house or walking on flat ground. Her bendopnea has resolved.  She was sleeping on 4 pillows, now down to 2 pillows. She does not drink ETOH or use drugs.  She works as a Engineer, manufacturing.  She has no family history of cardiac disease or sudden death.  No recent viral-type infections.   ECG (personally reviewed): NSR, LBBB 144 msec  Labs (2/24): Pro-BNP 1519, K 3.7, creatinine 0.93  PMH:  1. LBBB 2. Chronic systolic CHF: Echo (6/22) with EF 20-25%, severe functional MR, mild RV dysfunction.  - LHC/RHC (1/24): No significant CAD; mean RA 15, PA 58/21 mean 37, mean PCWP 26, CI 2.18, PVR 2.3 WU, PAPi 2.5.  3. Mitral regurgitation: Suspect functional, severe on 1/24 echo.  4. NAFLD: Chronic elevated LFTs.  - Abdominal US (1/24): Fatty liver.   SH: Married, lives in [place], works as Engineer, manufacturing, no drugs, no ETOH, no smoking.   FH: No family history of CHF or sudden death.   ROS: All systems reviewed and negative except as per HPI.   Current Outpatient Medications  Medication Sig Dispense Refill   acetaminophen (TYLENOL) 500 MG  tablet Take 500 mg by mouth every 6 (six) hours as needed for headache, moderate pain or mild pain.     carvedilol (COREG) 3.125 MG tablet Take 1 tablet (3.125 mg total) by mouth 2 (two) times daily with a meal. 180 tablet 3   cetirizine (ZYRTEC) 10 MG tablet Take 10 mg by mouth daily.     dapagliflozin propanediol (FARXIGA) 10 MG TABS tablet Take 1 tablet (10 mg total) by mouth daily before breakfast. 30 tablet 11   furosemide (LASIX) 40 MG tablet Take two tablets (80 mg total) by mouth in the morning and one tablet (40 mg) in the evening. 270 tablet 3   ondansetron (ZOFRAN-ODT) 4 MG disintegrating tablet Take 1 tablet (4 mg total) by mouth every 8 (eight) hours as needed for nausea or vomiting. 20 tablet 0   pantoprazole (PROTONIX) 40 MG tablet Take 1 tablet (40 mg total) by mouth daily. 30 tablet 1   potassium chloride SA (KLOR-CON M) 20 MEQ tablet Take two tablets (40 meq) by mouth in the morning and one tablet (20 meq) in the evening. 270 tablet 3   sacubitril-valsartan (ENTRESTO) 24-26 MG Take 1 tablet by mouth 2 (two) times daily. 180 tablet 3   spironolactone (ALDACTONE) 25 MG tablet Take 0.5 tablets (12.5 mg total) by mouth daily. 45 tablet 3   No current facility-administered medications for this encounter.   BP 104/64   Pulse 96   Wt 106.1 kg (234 lb)  LMP 05/23/2022 (Exact Date)   SpO2 97%   BMI 41.45 kg/m  General: NAD Neck: No JVD, no thyromegaly or thyroid nodule.  Lungs: Clear to auscultation bilaterally with normal respiratory effort. CV: Nondisplaced PMI.  Heart regular S1/S2, no S3/S4, 1/6 HSM apex.  No peripheral edema.  No carotid bruit.  Normal pedal pulses.  Abdomen: Soft, nontender, no hepatosplenomegaly, no distention.  Skin: Intact without lesions or rashes.  Neurologic: Alert and oriented x 3.  Psych: Normal affect. Extremities: No clubbing or cyanosis.  HEENT: Normal.   Assessment/Plan: 1. Chronic systolic CHF: Nonischemic cardiomyopathy.  Echo in 1/24  showed EF 20-25%, severe functional MR, mild RV dysfunction. ECG showed LBBB.  RHC/LHC in 1/24 showed no CAD, elevated filling pressures and low but not markedly low cardiac output. No ETOH or drug history.  TSH normal.  No family history of cardiomyopathy or sudden death.  ?Viral myocarditis as etiology or LBBB cardiomyopathy.  Symptomatically improved on Lasix, NYHA class II now.  She does not look volume overloaded today.  - I will arrange for cardiac MRI to look for infiltrative disease or myocarditis.  - Continue Coreg 3.125 mg bid.  - Continue Entresto 24/26 bid.  - Continue Lasix 80 qam/40 qpm.  - Add spironolactone 12.5 daily with BMET/BNP today and BMET in 10 days.  - Add Farxiga 10 mg daily.  - If LV function does not improve with GDMT, she would likely be a candidate for CRT-D.  2. Mitral regurgitation: Severe on 1/24 echo though she does not have a loud murmur.  Suspect functional.  - Reassess MR on optimized GDMT, hopefully will improve.  If she still has severe MR, would consider mTEER.  3. Elevated LFTs: Fatty liver on [place], suspect NAFLD.  Congestive hepatopathy likely plays a role.  - Repeat LFTs today.   Followup with HF pharmacist in 3 wks x 2 visits for med titration, followup with APP in 2 months.   Loralie Champagne 06/08/2022

## 2022-06-08 NOTE — Patient Instructions (Addendum)
START Spironolactone 12.5mg  (1/2 Tab) daily.  START Farxiga 10 mg daily.  Labs done today, your results will be available in MyChart, we will contact you for abnormal readings.  Repeat blood work in 10 days.  Your physician has requested that you have a cardiac MRI. Cardiac MRI uses a computer to create images of your heart as its beating, producing both still and moving pictures of your heart and major blood vessels. For further information please visit [email or web address] Please follow the instruction sheet given to you today for more information. ONCE APPROVED BY INSURANCE YOU WILL BE CALLED TO HAVE THE TEST ARRANGED.  Please follow up with our heart failure pharmacist in 3 weeks  Your physician recommends that you schedule a follow-up appointment in: 2 months.  If you have any questions or concerns before your next appointment please send [place] a message through [place] or call our office at [phone].    TO LEAVE A MESSAGE FOR THE NURSE SELECT OPTION 2, PLEASE LEAVE A MESSAGE INCLUDING: YOUR NAME DATE OF BIRTH CALL BACK NUMBER REASON FOR CALL**this is important as we prioritize the call backs  YOU WILL RECEIVE A CALL BACK THE SAME DAY AS LONG AS YOU CALL BEFORE 4:00 PM  At the [facility], you and your health needs are our priority. As part of our continuing mission to provide you with exceptional heart care, we have created designated Provider Care Teams. These Care Teams include your primary Cardiologist (physician) and Advanced Practice Providers (APPs- Physician Assistants and Nurse Practitioners) who all work together to provide you with the care you need, when you need it.   You may see any of the following providers on your designated Care Team at your next follow up: Dr Glori Bickers Dr Loralie Champagne Dr. Roxana Hires, NP Lyda Jester, [place] [facility] [place], [place] Forestine Na, NP Audry Riles, PharmD   Please be sure  to bring in all your medications bottles to every appointment.    Thank you for choosing [facility]

## 2022-06-09 ENCOUNTER — Telehealth (HOSPITAL_COMMUNITY): Payer: Self-pay

## 2022-06-09 NOTE — Telephone Encounter (Signed)
Forms faxed to [facility] patient aware

## 2022-06-10 ENCOUNTER — Encounter (HOSPITAL_COMMUNITY): Payer: Self-pay

## 2022-06-12 ENCOUNTER — Telehealth (HOSPITAL_COMMUNITY): Payer: Self-pay

## 2022-06-12 ENCOUNTER — Other Ambulatory Visit (HOSPITAL_COMMUNITY): Payer: Self-pay

## 2022-06-12 MED ORDER — EMPAGLIFLOZIN 10 MG PO TABS: 10.0000 mg | ORAL_TABLET | Freq: Every day | ORAL | 11 refills | Status: DC

## 2022-06-12 NOTE — Telephone Encounter (Signed)
Advanced Heart Failure Patient Advocate Encounter  Patient was recommended to start Farxiga. Insurance showed prior auth required for this medication. Submitted Key [id number] using Cover My Meds and received a notice that Wilder Glade is not covered on this plan.  Prior auth submitted for Jardiance and approved on 06/12/22 Key [id number] Effective: 05/13/22 - 06/12/2023  New rx has been sent in for Jardiance and I have spoken with the patient by phone to confirm the medication change, as well as available copay card access information.  Clista Bernhardt, CPhT Rx Patient Advocate Phone: [phone]

## 2022-06-12 NOTE — Telephone Encounter (Signed)
Patient changed to [place].

## 2022-06-15 ENCOUNTER — Ambulatory Visit: Payer: 59 | Attending: Internal Medicine

## 2022-06-15 DIAGNOSIS — Z79899 Other long term (current) drug therapy: Secondary | ICD-10-CM

## 2022-06-15 DIAGNOSIS — R7989 Other specified abnormal findings of blood chemistry: Secondary | ICD-10-CM

## 2022-06-16 LAB — PRO B NATRIURETIC PEPTIDE: NT-Pro BNP: 1176 pg/mL — ABNORMAL HIGH (ref 0–130)

## 2022-06-16 LAB — BASIC METABOLIC PANEL
BUN/Creatinine Ratio: 23 (ref 9–23)
BUN: 19 mg/dL (ref 6–20)
CO2: 23 mmol/L (ref 20–29)
Calcium: 9.7 mg/dL (ref 8.7–10.2)
Chloride: 100 mmol/L (ref 96–106)
Creatinine, Ser: 0.82 mg/dL (ref 0.57–1.00)
Glucose: 122 mg/dL — ABNORMAL HIGH (ref 70–99)
Potassium: 4.7 mmol/L (ref 3.5–5.2)
Sodium: 139 mmol/L (ref 134–144)
eGFR: 93 mL/min/{1.73_m2} (ref >59)

## 2022-06-27 NOTE — Progress Notes (Incomplete)
***  In Progress***    Advanced Heart Failure Clinic Note   PCP: [name], DO Cardiology: Dr. Harrington Challenger HF Cardiology: Dr. Aundra Dubin  HPI:   42 y.o. with history of cardiomyopathy was referred by Dr. Harrington Challenger for evaluation of CHF. In 03/2022, patient developed abdominal pain, nausea, vomiting, and diarrhea. RUQ US showed fatty liver. She does not drink ETOH. She was thought to have NAFLD. She also noted the development of significant exertional dyspnea with most activities, even just walking around the house. Echo was done in 05/2022 showing EF 20-25%, severe functional MR, mild RV dysfunction. ECG showed LBBB. She was started on Lasix for diuresis. RHC/LHC in 05/2022 showed no CAD, elevated filling pressures and low but not markedly low cardiac output. Lasix was increased to 80 mg qam and 40 mg qpm.     At last visit with MD on 06/08/22, patient's abdominal pain had resolved. Breathing was much better; was no longer short of breath walking around house or walking on flat ground. Her bendopnea had resolved. 4 pillow orthopnea has improved to 2 pillows. She was not drinking ETOH or using drugs. 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 No recent viral-type infections.   HF Medications:   Has the patient been experiencing any side effects to the medications prescribed?  {YES NO:22349}  Does the patient have any problems obtaining medications due to transportation or finances?   {YES NO:22349}  Understanding of regimen: {excellent/good/fair/poor:19665} Understanding of indications: {excellent/good/fair/poor:19665} Potential of compliance: {excellent/good/fair/poor:19665} Patient understands to avoid NSAIDs. Patient understands to avoid decongestants.    Pertinent Lab Values: Serum creatinine ***, BUN ***, Potassium ***, Sodium ***, BNP ***, Magnesium ***, Digoxin ***   Vital Signs: Weight: *** (last clinic weight: ***) Blood  pressure: ***  Heart rate: ***   Assessment/Plan: 1. Chronic systolic CHF: Nonischemic cardiomyopathy.  Echo in 05/2022 showed EF 20-25%, severe functional MR, mild RV dysfunction. ECG showed LBBB.  RHC/LHC in 05/2022 showed no CAD, elevated filling pressures and low but not markedly low cardiac output. No ETOH or drug history. TSH normal. No family history of cardiomyopathy or sudden death. ?Viral myocarditis as etiology or LBBB cardiomyopathy. Symptomatically improved on Lasix, NYHA class II now. She does not look volume overloaded today.  - I will arrange for cardiac MRI to look for infiltrative disease or myocarditis.  - Continue Lasix 80 qam/40 qpm.  - Continue Coreg 3.125 mg bid.  - Continue Entresto 24/26 bid.  - Continue spironolactone 12.5 daily  - Continue Farxiga 10 mg daily.  - If LV function does not improve with GDMT, she would likely be a candidate for CRT-D.   2. Mitral regurgitation: Severe on 05/2022 echo though she does not have a loud murmur.  Suspect functional.  - Reassess MR on optimized GDMT, hopefully will improve. If she still has severe MR, MD to consider mTEER.   3. Elevated LFTs: Fatty liver on [place], suspect NAFLD.  Congestive hepatopathy likely plays a role.  - Repeat LFTs today.    Followup with HF pharmacist in 3 wks x 2 visits for med titration, followup with APP in 2 months  Follow up ***   Audry Riles, PharmD, BCPS, BCCP, CPP Heart Failure Clinic Pharmacist [phone]

## 2022-06-28 ENCOUNTER — Other Ambulatory Visit (HOSPITAL_COMMUNITY): Payer: Self-pay

## 2022-06-28 ENCOUNTER — Ambulatory Visit (HOSPITAL_COMMUNITY)
Admission: RE | Admit: 2022-06-28 | Discharge: 2022-06-28 | Disposition: A | Discharge status: Home / Self Care | Payer: 59 | Source: Ambulatory Visit | Attending: Internal Medicine | Admitting: Internal Medicine

## 2022-06-28 VITALS — BP 96/68 | HR 79

## 2022-06-28 DIAGNOSIS — K760 Fatty (change of) liver, not elsewhere classified: Secondary | ICD-10-CM | POA: Diagnosis not present

## 2022-06-28 DIAGNOSIS — I340 Nonrheumatic mitral (valve) insufficiency: Secondary | ICD-10-CM | POA: Insufficient documentation

## 2022-06-28 DIAGNOSIS — Z7984 Long term (current) use of oral hypoglycemic drugs: Secondary | ICD-10-CM | POA: Insufficient documentation

## 2022-06-28 DIAGNOSIS — R7989 Other specified abnormal findings of blood chemistry: Secondary | ICD-10-CM | POA: Diagnosis not present

## 2022-06-28 DIAGNOSIS — R109 Unspecified abdominal pain: Secondary | ICD-10-CM | POA: Insufficient documentation

## 2022-06-28 DIAGNOSIS — Z79899 Other long term (current) drug therapy: Secondary | ICD-10-CM | POA: Insufficient documentation

## 2022-06-28 DIAGNOSIS — I5023 Acute on chronic systolic (congestive) heart failure: Secondary | ICD-10-CM | POA: Insufficient documentation

## 2022-06-28 DIAGNOSIS — I428 Other cardiomyopathies: Secondary | ICD-10-CM | POA: Insufficient documentation

## 2022-06-28 LAB — BASIC METABOLIC PANEL
Anion gap: 9 (ref 5–15)
BUN: 33 mg/dL — ABNORMAL HIGH (ref 6–20)
CO2: 24 mmol/L (ref 22–32)
Calcium: 9.1 mg/dL (ref 8.9–10.3)
Chloride: 100 mmol/L (ref 98–111)
Creatinine, Ser: 1.07 mg/dL — ABNORMAL HIGH (ref 0.44–1.00)
GFR, Estimated: >60 mL/min (ref >60)
Glucose, Bld: 114 mg/dL — ABNORMAL HIGH (ref 70–99)
Potassium: 4.6 mmol/L (ref 3.5–5.1)
Sodium: 133 mmol/L — ABNORMAL LOW (ref 135–145)

## 2022-06-28 LAB — BRAIN NATRIURETIC PEPTIDE: B Natriuretic Peptide: 81.6 pg/mL (ref 0.0–100.0)

## 2022-06-28 MED ORDER — FUROSEMIDE 40 MG PO TABS: ORAL_TABLET | ORAL | 3 refills | Status: DC

## 2022-06-28 MED ORDER — POTASSIUM CHLORIDE CRYS ER 20 MEQ PO TBCR: EXTENDED_RELEASE_TABLET | ORAL | 3 refills | Status: DC

## 2022-06-28 NOTE — Patient Instructions (Signed)
It was a pleasure seeing you today!  MEDICATIONS: -We are changing your medications today -Decrease furosemide to 40 mg twice daily. You can take an extra 20 mg (1/2 tablet) in the morning for weight gain of 3 lbs or more. -Decrease potassium to 40 meq once daily -Call if you have questions about your medications.  LABS: -We will call you if your labs need attention.  NEXT APPOINTMENT: Return to clinic in 1-2 weeks with pharmacy.  In general, to take care of your heart failure: -Limit your fluid intake to 2 Liters (half-gallon) per day.   -Limit your salt intake to ideally 2-3 grams (2000-3000 mg) per day. -Weigh yourself daily and record, and bring that "weight diary" to your next appointment.  (Weight gain of 2-3 pounds in 1 day typically means fluid weight.) -The medications for your heart are to help your heart and help you live longer.   -Please contact us before stopping any of your heart medications.  Call the clinic at [phone] with questions or to reschedule future appointments.

## 2022-06-28 NOTE — Progress Notes (Signed)
Advanced Heart Failure Clinic Note   PCP: [name], DO Cardiology: Dr. Harrington Challenger HF Cardiology: Dr. Aundra Dubin  HPI:  42 y.o. with history of cardiomyopathy was referred by Dr. Harrington Challenger for evaluation of CHF. In 03/2022, patient developed abdominal pain, nausea, vomiting, and diarrhea. RUQ US showed fatty liver. She does not drink ETOH. She was thought to have NAFLD. She also noted the development of significant exertional dyspnea with most activities, even just walking around the house. Echo was done in 05/2022 showing EF 20-25%, severe functional MR, mild RV dysfunction. ECG showed LBBB. She was started on furosemide for diuresis. RHC/LHC in 05/2022 showed no CAD, elevated filling pressures and low but not markedly low cardiac output. furosemide was increased to 80 mg qam and 40 mg qpm.     At last visit with MD on 06/08/22, patient's abdominal pain had resolved. Breathing was much better; was no longer short of breath walking around house or walking on flat ground. Her bendopnea had resolved. 4 pillow orthopnea had improved to 2 pillows. She was not drinking ETOH or using drugs. 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 No recent viral-type infections.   Today she returns to HF clinic for pharmacist medication titration. She is overall feeling great. She denies lightheadedness and fatigue. She does experience dizziness when standing or bending over too quickly. This happens daily. Denies chest pain and palpitations. Breathing has been great. She denies SOB walking on flat surfaces. Able to complete all ADLs. Not very active throughout the day. She reports checking weights at home, but does not check daily. Takes furosemide 80 mg in the AM and 40 mg in the PM. Denies LEE, PND, and orthopnea today. Appetite has improved since she finished her antibiotics for GI infection. She does not check BP at home.  HF Medications: Carvedilol 3.125 mg  BID Entresto 24-26 mg BID Spironolactone 12.5 mg daily Jardiance 10 mg daily Furosemide 80 mg in the AM and 40 mg in the PM + potassium 40 meq in the AM and 20 meq in the PM   Has the patient been experiencing any side effects to the medications prescribed?  She has experienced orthostasis.  Does the patient have any problems obtaining medications due to transportation or finances?   No; [employer] commercial insurane  Understanding of regimen: excellent Understanding of indications: excellent Potential of compliance: excellent Patient understands to avoid NSAIDs. Patient understands to avoid decongestants.    Pertinent Lab Values today: Serum creatinine 1.07, BUN 33, Potassium 4.6. Sodium 133, BNP down to 81.6 from 356.2 on 06/08/22  Vital Signs: Weight: 223 lbs (last clinic weight: 234 lbs) Blood pressure: 96/58 mmHg  Heart rate: 79 bpm   Assessment/Plan: 1. Chronic systolic CHF: Nonischemic cardiomyopathy. Echo in 05/2022 showed EF 20-25%, severe functional MR, mild RV dysfunction. ECG showed LBBB.  RHC/LHC in 05/2022 showed no CAD, elevated filling pressures and low but not markedly low cardiac output. No ETOH or drug history. TSH normal. No family history of cardiomyopathy or sudden death. ?Viral myocarditis as etiology or LBBB cardiomyopathy. Symptomatically improved on furosemide, NYHA class II now. She does not look volume overloaded today and labs that returned after patient visit confirmed she was dry.  - MD planning cardiac MRI to look for infiltrative disease or myocarditis.  - Decrease Lasix to 40 mg BID with orthostasis, hypotension, and elevated creatinine/BUN. BNP today was WNL. Will also decrease potassium to 40 meq  daily. Potassium is her most expensive medication with a $70 copay. Hopefully we will be able to increase spironolactone to full dose and stop potassium supplementation if BP improves. - Continue carvedilol 3.125 mg BID.  - Continue Entresto 24/26 mg BID.  - Continue  spironolactone 12.5 mg daily  - Continue Farxiga 10 mg daily.  - If LV function does not improve with GDMT, she would likely be a candidate for CRT-D.   2. Mitral regurgitation: Severe on 05/2022 echo though she does not have a loud murmur.  Suspect functional.  - Reassess MR on optimized GDMT, hopefully will improve. If she still has severe MR, MD to consider mTEER.   3. Elevated LFTs: Fatty liver on [place], suspect NAFLD.  Congestive hepatopathy likely plays a role.  - Repeat LFTs today.    Followup with Pharmacy Clinic in 2 weeks.   Audry Riles, PharmD, BCPS, BCCP, CPP Heart Failure Clinic Pharmacist [phone]

## 2022-07-10 ENCOUNTER — Other Ambulatory Visit (HOSPITAL_COMMUNITY): Payer: 59

## 2022-07-11 NOTE — Progress Notes (Incomplete)
***In Progress***    Advanced Heart Failure Clinic Note   PCP: [name], DO Cardiology: Dr. Harrington Challenger HF Cardiology: Dr. Aundra Dubin  HPI:  42 y.o. with history of cardiomyopathy was referred by Dr. Harrington Challenger for evaluation of CHF. In 03/2022, patient developed abdominal pain, nausea, vomiting, and diarrhea. RUQ US showed fatty liver. She does not drink ETOH. She was thought to have NAFLD. She also noted the development of significant exertional dyspnea with most activities, even just walking around the house. Echo was done in 05/2022 showing EF 20-25%, severe functional MR, mild RV dysfunction. ECG showed LBBB. She was started on furosemide for diuresis. RHC/LHC in 05/2022 showed no CAD, elevated filling pressures and low but not markedly low cardiac output. Furosemide was increased to 80 mg qam and 40 mg qpm.     At last visit with MD on 06/08/22, patient's abdominal pain had resolved. Breathing was much better; was no longer short of breath walking around house or walking on flat ground. Her bendopnea had resolved. 4 pillow orthopnea had improved to 2 pillows. She was not drinking ETOH or using drugs. 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 No recent viral-type infections.    She returned on 06/28/22 for follow-up with PharmD. She was overall feeling great. She denied lightheadedness and fatigue. She did experience dizziness when standing or bending over too quickly, which happened daily. Denied chest pain and palpitations. Breathing had been great. She denied SOB walking on flat surfaces. Able to complete all ADLs. Not very active throughout the day. She reported checking weights at home, but does not check daily. Takes furosemide 80 mg in the AM and 40 mg in the PM. Denies LEE, PND, and orthopnea today. Appetite had improved since she finished her antibiotics for GI infection. She did not check BP at home.  Today she returns to HF clinic for  pharmacist medication titration. At last visit with PharmD, Lasix was decreased due to orthostasis, hypotension and elevated creatinine/BUN. Additionally, potassium supplementation was decreased.    Overall feeling ***. Dizziness, lightheadedness, fatigue:  Chest pain or palpitations:  How is your breathing?: *** SOB: Able to complete all ADLs. Activity level ***  Weight at home pounds. Takes furosemide/torsemide/bumex *** mg *** daily.  LEE PND/Orthopnea  Appetite *** Low-salt diet:   Physical Exam Cost/affordability of meds   Shortness of breath/dyspnea on exertion? {YES NO:22349}  Orthopnea/PND? {YES NO:22349} Edema? {YES NO:22349} Lightheadedness/dizziness? {YES NO:22349} Daily weights at home? {YES NO:22349} Blood pressure/heart rate monitoring at home? {YES V2345720 Following low-sodium/fluid-restricted diet? {YES NO:22349}  HF Medications: Carvedilol 3.125 mg BID Entresto 24/26 mg BID Spironolactone 12.5 mg daily Jardiance 10 mg daily Furosemide 40 mg BID + potassium 40 meq daily  Has the patient been experiencing any side effects to the medications prescribed?  YES NO  Does the patient have any problems obtaining medications due to transportation or finances?  No, [facility] Oncologist of regimen: {excellent/good/fair/poor:19665} Understanding of indications: {excellent/good/fair/poor:19665} Potential of compliance: {excellent/good/fair/poor:19665} Patient understands to avoid NSAIDs. Patient understands to avoid decongestants.    Pertinent Lab Values: 06/28/22: Serum creatinine 1.07, BUN 33, Potassium 4.6, Sodium 133, BNP 81.6  Vital Signs: Weight: *** (last clinic weight: 234 lbs) Blood pressure: ***  Heart rate: ***   Assessment/Plan: 1. Chronic systolic CHF: Nonischemic cardiomyopathy. Echo in 05/2022 showed EF 20-25%, severe functional MR, mild RV dysfunction. ECG showed LBBB.  RHC/LHC in 05/2022 showed no CAD,  elevated filling  pressures and low but not markedly low cardiac output. No ETOH or drug history. TSH normal. No family history of cardiomyopathy or sudden death. ?Viral myocarditis as etiology or LBBB cardiomyopathy. Symptomatically improved on furosemide, NYHA class II. ***  - MD planning cardiac MRI to look for infiltrative disease or myocarditis.  - Lasix 40 mg BID + potassium 40 meq daily. *** - Continue carvedilol 3.125 mg BID.  - Continue Entresto 24/26 mg BID.  - Continue spironolactone 12.5 mg daily *** - Continue Farxiga 10 mg daily.  - If LV function does not improve with GDMT, she would likely be a candidate for CRT-D.    2. Mitral regurgitation: Severe on 05/2022 echo though she does not have a loud murmur.  Suspect functional.  - Reassess MR on optimized GDMT, hopefully will improve. If she still has severe MR, MD to consider mTEER.    3. Elevated LFTs: Fatty liver on [place], suspect NAFLD.  Congestive hepatopathy likely plays a role.   Follow up with APP on 08/07/22.   Sinda Du, PharmD Candidate  Audry Riles, PharmD, BCPS, BCCP, CPP Heart Failure Clinic Pharmacist [phone]

## 2022-07-13 ENCOUNTER — Ambulatory Visit (HOSPITAL_COMMUNITY)
Admission: RE | Admit: 2022-07-13 | Discharge: 2022-07-13 | Disposition: A | Discharge status: Home / Self Care | Payer: 59 | Source: Ambulatory Visit | Attending: Cardiology | Admitting: Cardiology

## 2022-07-13 VITALS — BP 102/70 | HR 78 | Wt 223.0 lb

## 2022-07-13 DIAGNOSIS — I428 Other cardiomyopathies: Secondary | ICD-10-CM | POA: Insufficient documentation

## 2022-07-13 DIAGNOSIS — I5022 Chronic systolic (congestive) heart failure: Secondary | ICD-10-CM | POA: Insufficient documentation

## 2022-07-13 DIAGNOSIS — R7989 Other specified abnormal findings of blood chemistry: Secondary | ICD-10-CM | POA: Diagnosis not present

## 2022-07-13 DIAGNOSIS — I340 Nonrheumatic mitral (valve) insufficiency: Secondary | ICD-10-CM | POA: Diagnosis not present

## 2022-07-13 LAB — BASIC METABOLIC PANEL
Anion gap: 10 (ref 5–15)
BUN: 17 mg/dL (ref 6–20)
CO2: 25 mmol/L (ref 22–32)
Calcium: 9.4 mg/dL (ref 8.9–10.3)
Chloride: 102 mmol/L (ref 98–111)
Creatinine, Ser: 0.83 mg/dL (ref 0.44–1.00)
GFR, Estimated: >60 mL/min (ref >60)
Glucose, Bld: 122 mg/dL — ABNORMAL HIGH (ref 70–99)
Potassium: 4.6 mmol/L (ref 3.5–5.1)
Sodium: 137 mmol/L (ref 135–145)

## 2022-07-13 MED ORDER — POTASSIUM CHLORIDE CRYS ER 20 MEQ PO TBCR: 20.0000 meq | EXTENDED_RELEASE_TABLET | Freq: Every day | ORAL | 3 refills | Status: DC

## 2022-07-13 MED ORDER — FUROSEMIDE 40 MG PO TABS: 40.0000 mg | ORAL_TABLET | Freq: Every day | ORAL | 3 refills | Status: DC

## 2022-07-13 MED ORDER — SPIRONOLACTONE 25 MG PO TABS: 25.0000 mg | ORAL_TABLET | Freq: Every day | ORAL | 3 refills | Status: DC

## 2022-07-13 NOTE — Progress Notes (Signed)
Advanced Heart Failure Clinic Note   PCP: [name], DO Cardiology: Dr. Harrington Challenger HF Cardiology: Dr. Aundra Dubin  HPI:  42 y.o. with history of cardiomyopathy was referred by Dr. Harrington Challenger for evaluation of CHF. In 03/2022, patient developed abdominal pain, nausea, vomiting, and diarrhea. RUQ US showed fatty liver. She does not drink ETOH. She was thought to have NAFLD. She also noted the development of significant exertional dyspnea with most activities, even just walking around the house. Echo was done in 05/2022 showing EF 20-25%, severe functional MR, mild RV dysfunction. ECG showed LBBB. She was started on Lasix for diuresis. RHC/LHC in 05/2022 showed no CAD, elevated filling pressures and low but not markedly low cardiac output. Lasix was increased to 80 mg qam and 40 mg qpm.    At last visit with MD on 06/08/22, patient's abdominal pain had resolved. Breathing was much better; was no longer short of breath walking around house or walking on flat ground. Her bendopnea had resolved. 4 pillow orthopnea had improved to 2 pillows. She was not drinking ETOH or using drugs. 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 No recent viral-type infections.   She returned on 06/28/22 for follow-up with Pharmacy Clinic. She was overall feeling great. She denied lightheadedness and fatigue. She did experience dizziness when standing or bending over too quickly, which happened daily. Denied chest pain and palpitations. Breathing had been great. She denied SOB walking on flat surfaces. Able to complete all ADLs. Not very active throughout the day. She reported checking weights at home, but does not check daily. Was taking Lasix 80 mg in the AM and 40 mg in the PM as prescribed. Denied LEE, PND, and orthopnea. Appetite had improved since she finished her antibiotics for GI infection. She did not check BP at home.  Today she returns to HF clinic for pharmacist  medication titration. At last visit with Pharmacy Clinic, Lasix was decreased to 40 mg BID due to orthostasis, hypotension and elevated creatinine/BUN. Additionally, potassium supplementation was decreased to 40 mEq daily. Overall is feeling okay today. Reports fatigue has been improving. Did have some sharp chest pain that lasted a few minutes but does not report other episodes of chest pain. Denies palpitations, previously reported racing heart. BP at home around 98-103/56-71, HR usually 70-80's. Breathing has been okay and SOB has subsided. Is able to walk on flat surfaces without any trouble. Has started to walk for exercise in 15 minute intervals and that has been going well. Weight at home ~222 lbs. No LEE on exam. Was previously wearing compression stockings but has not needed them recently but may utilize them when returning work. Denies PND/orthopnea. Bendopnea has not resolved despite decreasing Lasix at last visit. Additionally. reports dizziness and lightheadedness occasionally when standing. Appetite has been okay, is following a low-salt diet. Not limiting fluid intake currently.   HF Medications: Carvedilol 3.125 mg BID Entresto 24/26 mg BID Spironolactone 12.5 mg daily Jardiance 10 mg daily Lasix 40 mg BID + potassium 40 mEq daily  Has the patient been experiencing any side effects to the medications prescribed? NO  Does the patient have any problems obtaining medications due to transportation or finances?  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 Patient understands to avoid decongestants.    Pertinent Lab Values: 06/28/22: Serum creatinine 1.07, BUN 33, Potassium 4.6, Sodium 133, BNP 81.6 BMET today  pending  Vital Signs: Weight: 223 lbs (last clinic weight: 223 lbs) Blood pressure: 102/70 mm Hg Heart rate: 78   Assessment/Plan: 1. Chronic systolic CHF: Nonischemic cardiomyopathy. Echo in 05/2022 showed EF 20-25%, severe functional MR, mild RV dysfunction. ECG showed LBBB.  RHC/LHC in 05/2022 showed no CAD, elevated filling pressures and low but not markedly low cardiac output. No ETOH or drug history. TSH normal. No family history of cardiomyopathy or sudden death. ?Viral myocarditis as etiology or LBBB cardiomyopathy.  - NYHA class II. Euvolemic on exam, bendopnea has not improved.  - MD planning cardiac MRI to look for infiltrative disease or myocarditis.  - Decrease Lasix to 40 mg daily + decrease potassium to 20 mEq daily. She can take an extra tablet of Lasix in the evenings if gains 3 lbs overnight/5 lbs in 1 week or has signs of elevated fluid status.  - Continue carvedilol 3.125 mg BID.  - Continue Entresto 24/26 mg BID.  - Increase spironolactone to 25 mg daily starting on 07/17/22.  - Continue Farxiga 10 mg daily.  - If LV function does not improve with GDMT, she would likely be a candidate for CRT-D.    2. Mitral regurgitation: Severe on 05/2022 echo though she does not have a loud murmur.  Suspect functional.  - Reassess MR on optimized GDMT, hopefully will improve. If she still has severe MR, MD to consider mTEER.    3. Elevated LFTs: Fatty liver on [place], suspect NAFLD.  Congestive hepatopathy likely plays a role.   Follow up with APP on 08/07/22.   Sinda Du, PharmD Candidate  Audry Riles, PharmD, BCPS, BCCP, CPP Heart Failure Clinic Pharmacist [phone]

## 2022-07-13 NOTE — Patient Instructions (Signed)
It was a pleasure seeing you today!  MEDICATIONS: -We are changing your medications today -Decrease Lasix to 40 mg (1 tablet) daily. You may take an extra Lasix tablet (40 mg) in the evening if you gain 3 lbs overnight or 5 lbs in a week or have signs of extra fluid.  -Decrease Potassium chloride to 20 mg (1 tablet) daily -Increase spironolactone to 25 mg (1 tablet) daily. Make this change next Monday (07/17/22).  -Call if you have questions about your medications.  LABS: -We will call you if your labs need attention.  NEXT APPOINTMENT: Return to clinic in 4 weeks with APP Clinic.  In general, to take care of your heart failure: -Limit your fluid intake to 2 Liters (half-gallon) per day.   -Limit your salt intake to ideally 2-3 grams (2000-3000 mg) per day. -Weigh yourself daily and record, and bring that "weight diary" to your next appointment.  (Weight gain of 2-3 pounds in 1 day typically means fluid weight.) -The medications for your heart are to help your heart and help you live longer.   -Please contact us before stopping any of your heart medications.  Call the clinic at [phone] with questions or to reschedule future appointments.

## 2022-08-04 NOTE — Progress Notes (Signed)
PCP: Sharlene Dory, DO Cardiology: Dr. Tenny Craw HF Cardiology: Dr. Shirlee Latch  42 y.o. with history of cardiomyopathy was referred by Dr. Tenny Craw for evaluation of CHF.  In 12/23, patient developed abdominal pain, nausea, vomiting, and diarrhea. RUQ US showed fatty liver.  She does not drink ETOH.  She was thought to have NAFLD.  She also noted the development of significant exertional dyspnea with most activities, even just walking around the house.  Echo was done in 1/24 showing EF 20-25%, severe functional MR, mild RV dysfunction. ECG showed LBBB.  She was started on Lasix for diuresis.  RHC/LHC in 1/24 showed no CAD, elevated filling pressures and low but not markedly low cardiac output.  Lasix was increased, now she is taking 80 qam/40 qpm.    Patient's abdominal pain has resolved.  Breathing is much better, no longer short of breath walking around house or walking on flat ground. Her bendopnea has resolved.  She was sleeping on 4 pillows, now down to 2 pillows. She does not drink ETOH or use drugs.  She works as a Teacher, early years/pre.  She has no family history of cardiac disease or sudden death.  No recent viral-type infections.   ECG (personally reviewed): NSR, LBBB 144 msec  Labs (2/24): Pro-BNP 1519, K 3.7, creatinine 0.93  PMH:  1. LBBB 2. Chronic systolic CHF: Echo (1/24) with EF 20-25%, severe functional MR, mild RV dysfunction.  - LHC/RHC (1/24): No significant CAD; mean RA 15, PA 58/21 mean 37, mean PCWP 26, CI 2.18, PVR 2.3 WU, PAPi 2.5.  3. Mitral regurgitation: Suspect functional, severe on 1/24 echo.  4. NAFLD: Chronic elevated LFTs.  - Abdominal US (1/24): Fatty liver.   SH: Married, lives in [place], works as Teacher, early years/pre, no drugs, no ETOH, no smoking.   FH: No family history of CHF or sudden death.   ROS: All systems reviewed and negative except as per HPI.   Current Outpatient Medications  Medication Sig Dispense Refill   acetaminophen (TYLENOL) 500 MG  tablet Take 500 mg by mouth every 6 (six) hours as needed for headache, moderate pain or mild pain.     carvedilol (COREG) 3.125 MG tablet Take 1 tablet (3.125 mg total) by mouth 2 (two) times daily with a meal. 180 tablet 3   cetirizine (ZYRTEC) 10 MG tablet Take 10 mg by mouth daily.     empagliflozin (JARDIANCE) 10 MG TABS tablet Take 1 tablet (10 mg total) by mouth daily before breakfast. 30 tablet 11   furosemide (LASIX) 40 MG tablet Take 1 tablet (40 mg total) by mouth daily. 90 tablet 3   potassium chloride SA (KLOR-CON M) 20 MEQ tablet Take 1 tablet (20 mEq total) by mouth daily. 90 tablet 3   sacubitril-valsartan (ENTRESTO) 24-26 MG Take 1 tablet by mouth 2 (two) times daily. 180 tablet 3   spironolactone (ALDACTONE) 25 MG tablet Take 1 tablet (25 mg total) by mouth daily. 90 tablet 3   No current facility-administered medications for this visit.   There were no vitals taken for this visit. General: NAD Neck: No JVD, no thyromegaly or thyroid nodule.  Lungs: Clear to auscultation bilaterally with normal respiratory effort. CV: Nondisplaced PMI.  Heart regular S1/S2, no S3/S4, 1/6 HSM apex.  No peripheral edema.  No carotid bruit.  Normal pedal pulses.  Abdomen: Soft, nontender, no hepatosplenomegaly, no distention.  Skin: Intact without lesions or rashes.  Neurologic: Alert and oriented x 3.  Psych: Normal affect. Extremities: No clubbing  or cyanosis.  HEENT: Normal.   Assessment/Plan: 1. Chronic systolic CHF: Nonischemic cardiomyopathy.  Echo in 1/24 showed EF 20-25%, severe functional MR, mild RV dysfunction. ECG showed LBBB.  RHC/LHC in 1/24 showed no CAD, elevated filling pressures and low but not markedly low cardiac output. No ETOH or drug history.  TSH normal.  No family history of cardiomyopathy or sudden death.  ?Viral myocarditis as etiology or LBBB cardiomyopathy.  Symptomatically improved on Lasix, NYHA class II now.  She does not look volume overloaded today.  - I will  arrange for cardiac MRI to look for infiltrative disease or myocarditis.  - Continue Coreg 3.125 mg bid.  - Continue Entresto 24/26 bid.  - Continue Lasix 80 qam/40 qpm.  - Add spironolactone 12.5 daily with BMET/BNP today and BMET in 10 days.  - Add Farxiga 10 mg daily.  - If LV function does not improve with GDMT, she would likely be a candidate for CRT-D.  2. Mitral regurgitation: Severe on 1/24 echo though she does not have a loud murmur.  Suspect functional.  - Reassess MR on optimized GDMT, hopefully will improve.  If she still has severe MR, would consider mTEER.  3. Elevated LFTs: Fatty liver on [place], suspect NAFLD.  Congestive hepatopathy likely plays a role.  - Repeat LFTs today.   Followup with HF pharmacist in 3 wks x 2 visits for med titration, followup with APP in 2 months.   Anderson Malta [facility] 08/04/2022

## 2022-08-07 ENCOUNTER — Encounter (HOSPITAL_COMMUNITY): Payer: Self-pay

## 2022-08-07 ENCOUNTER — Ambulatory Visit (HOSPITAL_COMMUNITY)
Admission: RE | Admit: 2022-08-07 | Discharge: 2022-08-07 | Disposition: A | Discharge status: Home / Self Care | Payer: 59 | Source: Ambulatory Visit | Attending: Family Medicine | Admitting: Family Medicine

## 2022-08-07 VITALS — BP 102/70 | HR 76 | Wt 233.0 lb

## 2022-08-07 DIAGNOSIS — R7989 Other specified abnormal findings of blood chemistry: Secondary | ICD-10-CM

## 2022-08-07 DIAGNOSIS — Z79899 Other long term (current) drug therapy: Secondary | ICD-10-CM | POA: Diagnosis not present

## 2022-08-07 DIAGNOSIS — K760 Fatty (change of) liver, not elsewhere classified: Secondary | ICD-10-CM | POA: Insufficient documentation

## 2022-08-07 DIAGNOSIS — I340 Nonrheumatic mitral (valve) insufficiency: Secondary | ICD-10-CM | POA: Diagnosis not present

## 2022-08-07 DIAGNOSIS — I428 Other cardiomyopathies: Secondary | ICD-10-CM | POA: Diagnosis not present

## 2022-08-07 DIAGNOSIS — I5022 Chronic systolic (congestive) heart failure: Secondary | ICD-10-CM | POA: Insufficient documentation

## 2022-08-07 DIAGNOSIS — I447 Left bundle-branch block, unspecified: Secondary | ICD-10-CM | POA: Diagnosis not present

## 2022-08-07 LAB — BASIC METABOLIC PANEL
Anion gap: 9 (ref 5–15)
BUN: 23 mg/dL — ABNORMAL HIGH (ref 6–20)
CO2: 26 mmol/L (ref 22–32)
Calcium: 9.2 mg/dL (ref 8.9–10.3)
Chloride: 99 mmol/L (ref 98–111)
Creatinine, Ser: 0.83 mg/dL (ref 0.44–1.00)
GFR, Estimated: >60 mL/min (ref >60)
Glucose, Bld: 134 mg/dL — ABNORMAL HIGH (ref 70–99)
Potassium: 4.3 mmol/L (ref 3.5–5.1)
Sodium: 134 mmol/L — ABNORMAL LOW (ref 135–145)

## 2022-08-07 LAB — BRAIN NATRIURETIC PEPTIDE: B Natriuretic Peptide: 43.2 pg/mL (ref 0.0–100.0)

## 2022-08-07 NOTE — Patient Instructions (Addendum)
Thank you for coming in today  Labs were done today, if any labs are abnormal the clinic will call you No news is good news  Medications: No changes  You were given a note for work  Follow up appointments: 3 months with Dr. Shirlee Latch with echocardiogram  Your physician has requested that you have an echocardiogram. Echocardiography is a painless test that uses sound waves to create images of your heart. It provides your doctor with information about the size and shape of your heart and how well your heart's chambers and valves are working. This procedure takes approximately one hour. There are no restrictions for this procedure.    You will receive a reminder letter in the mail a few months in advance. If you don't receive a letter, please call our office to schedule the follow-up appointment.   Your physician recommends that you schedule a follow-up appointment in:     Do the following things EVERYDAY: Weigh yourself in the morning before breakfast. Write it down and keep it in a log. Take your medicines as prescribed Eat low salt foods--Limit salt (sodium) to 2000 mg per day.  Stay as active as you can everyday Limit all fluids for the day to less than 2 liters   At the [facility], you and your health needs are our priority. As part of our continuing mission to provide you with exceptional heart care, we have created designated Provider Care Teams. These Care Teams include your primary Cardiologist (physician) and Advanced Practice Providers (APPs- Physician Assistants and Nurse Practitioners) who all work together to provide you with the care you need, when you need it.   You may see any of the following providers on your designated Care Team at your next follow up: Dr Arvilla Meres Dr Marca Ancona Dr. Marcos Eke, NP Robbie Lis, [place] [facility] [place], [place] Brynda Peon, NP Karle Plumber, PharmD   Please be sure to bring  in all your medications bottles to every appointment.    Thank you for choosing  [facility]  If you have any questions or concerns before your next appointment please send [place] a message through [place] or call our office at [phone].    TO LEAVE A MESSAGE FOR THE NURSE SELECT OPTION 2, PLEASE LEAVE A MESSAGE INCLUDING: YOUR NAME DATE OF BIRTH CALL BACK NUMBER REASON FOR CALL**this is important as we prioritize the call backs  YOU WILL RECEIVE A CALL BACK THE SAME DAY AS LONG AS YOU CALL BEFORE 4:00 PM

## 2022-08-07 NOTE — Progress Notes (Signed)
ReDS Vest / Clip - 08/07/22 0800       ReDS Vest / Clip   Station Marker B    Ruler Value 36    ReDS Value Range Low volume    ReDS Actual Value 28

## 2022-08-07 NOTE — Addendum Note (Signed)
Encounter addended by: Demetrius Charity, RN on: 08/07/2022 11:52 AM  Actions taken: Charge Capture section accepted

## 2022-08-09 ENCOUNTER — Encounter: Payer: Self-pay | Admitting: Family Medicine

## 2022-08-09 ENCOUNTER — Other Ambulatory Visit: Payer: Self-pay | Admitting: Family Medicine

## 2022-08-09 ENCOUNTER — Ambulatory Visit (INDEPENDENT_AMBULATORY_CARE_PROVIDER_SITE_OTHER): Payer: 59 | Admitting: Family Medicine

## 2022-08-09 VITALS — BP 104/70 | HR 78 | Temp 98.4°F | Ht 63.0 in | Wt 235.1 lb

## 2022-08-09 DIAGNOSIS — Z0000 Encounter for general adult medical examination without abnormal findings: Secondary | ICD-10-CM

## 2022-08-09 DIAGNOSIS — E785 Hyperlipidemia, unspecified: Secondary | ICD-10-CM

## 2022-08-09 LAB — HEPATIC FUNCTION PANEL
ALT: 22 U/L (ref 0–35)
AST: 15 U/L (ref 0–37)
Albumin: 4.7 g/dL (ref 3.5–5.2)
Alkaline Phosphatase: 73 U/L (ref 39–117)
Bilirubin, Direct: 0.1 mg/dL (ref 0.0–0.3)
Total Bilirubin: 0.4 mg/dL (ref 0.2–1.2)
Total Protein: 7.4 g/dL (ref 6.0–8.3)

## 2022-08-09 LAB — LIPID PANEL
Cholesterol: 244 mg/dL — ABNORMAL HIGH (ref 0–200)
HDL: 45.9 mg/dL (ref >39.00)
LDL Cholesterol: 175 mg/dL — ABNORMAL HIGH (ref 0–99)
NonHDL: 198.57
Total CHOL/HDL Ratio: 5
Triglycerides: 120 mg/dL (ref 0.0–149.0)
VLDL: 24 mg/dL (ref 0.0–40.0)

## 2022-08-09 NOTE — Patient Instructions (Signed)
Give us 2-3 business days to get the results of your labs back.   Keep the diet clean and stay active.  Please get me a copy of your advanced directive form at your convenience.   Let us know if you need anything.  

## 2022-08-09 NOTE — Progress Notes (Signed)
Chief Complaint  Patient presents with   Annual Exam     Well Woman Amber Stein is here for a complete physical.   Her last physical was >1 year ago.  Current diet: in general, a "healthy" diet. Current exercise: walking. Weight is decreasing (losing fluid from CHF issues) and she denies fatigue out of ordinary. Seatbelt? Yes Advanced directive? No  Health Maintenance Pap/HPV- Yes Mammogram- No Tetanus- Yes Hep C screening- Yes HIV screening- Yes  Past Medical History:  Diagnosis Date   NAFLD (nonalcoholic fatty liver disease)      Past Surgical History:  Procedure Laterality Date   PILONIDAL CYST EXCISION  2000   RIGHT/LEFT HEART CATH AND CORONARY ANGIOGRAPHY N/A 05/25/2022   Procedure: RIGHT/LEFT HEART CATH AND CORONARY ANGIOGRAPHY;  Surgeon: Laurey Morale, MD;  Location: [facility];  Service: Cardiovascular;  Laterality: N/A;    Medications  Current Outpatient Medications on File Prior to Visit  Medication Sig Dispense Refill   acetaminophen (TYLENOL) 500 MG tablet Take 500 mg by mouth every 6 (six) hours as needed for headache, moderate pain or mild pain.     carvedilol (COREG) 3.125 MG tablet Take 1 tablet (3.125 mg total) by mouth 2 (two) times daily with a meal. 180 tablet 3   cetirizine (ZYRTEC) 10 MG tablet Take 10 mg by mouth daily.     empagliflozin (JARDIANCE) 10 MG TABS tablet Take 1 tablet (10 mg total) by mouth daily before breakfast. 30 tablet 11   furosemide (LASIX) 40 MG tablet Take 1 tablet (40 mg total) by mouth daily. (Patient taking differently: Take 40 mg by mouth 2 (two) times daily.) 90 tablet 3   potassium chloride SA (KLOR-CON M) 20 MEQ tablet Take 1 tablet (20 mEq total) by mouth daily. 90 tablet 3   sacubitril-valsartan (ENTRESTO) 24-26 MG Take 1 tablet by mouth 2 (two) times daily. 180 tablet 3   spironolactone (ALDACTONE) 25 MG tablet Take 1 tablet (25 mg total) by mouth daily. 90 tablet 3   Allergies No Known  Allergies  Review of Systems: Constitutional:  no unexpected weight changes Eye:  no recent significant change in vision Ear/Nose/Mouth/Throat:  Ears:  no recent change in hearing Nose/Mouth/Throat:  no complaints of nasal congestion, no sore throat Cardiovascular: no chest pain Respiratory:  no shortness of breath Gastrointestinal:  no abdominal pain, no change in bowel habits GU:  Female: negative for dysuria or pelvic pain Musculoskeletal/Extremities:  no pain of the joints Integumentary (Skin/Breast):  no abnormal skin lesions reported Neurologic:  no headaches Endocrine:  denies fatigue Hematologic/Lymphatic:  No areas of easy bleeding  Exam BP 104/70 (BP Location: Left Arm, Patient Position: Sitting, Cuff Size: Normal)   Pulse 78   Temp 98.4 F (36.9 C) (Oral)   Ht 5\' 3"  (1.6 m)   Wt 235 lb 2 oz (106.7 kg)   SpO2 98%   BMI 41.65 kg/m  General:  well developed, well nourished, in no apparent distress Skin:  no significant moles, warts, or growths Head:  no masses, lesions, or tenderness Eyes:  pupils equal and round, sclera anicteric without injection Ears:  canals without lesions, TMs shiny without retraction, no obvious effusion, no erythema Nose:  nares patent, mucosa normal, and no drainage Throat/Pharynx:  lips and gingiva without lesion; tongue and uvula midline; non-inflamed pharynx; no exudates or postnasal drainage Neck: neck supple without adenopathy, thyromegaly, or masses Lungs:  clear to auscultation, breath sounds equal bilaterally, no respiratory distress Cardio:  regular rate and rhythm, no LE edema Abdomen:  abdomen soft, nontender; bowel sounds normal; no masses or organomegaly Genital: Defer to GYN Musculoskeletal:  symmetrical muscle groups noted without atrophy or deformity Extremities:  no clubbing, cyanosis, or edema, no deformities, no skin discoloration Neuro:  gait normal; deep tendon reflexes normal and symmetric Psych: well oriented with  normal range of affect and appropriate judgment/insight  Assessment and Plan  Well adult exam - Plan: Hepatic function panel, Lipid panel   Well 42 y.o. female. Counseled on diet and exercise. Advanced directive form provided today.  Discussed risks/benefits of breast cancer screening w mammogram at this stage in her life. No famhx. She would like to hold off on pursuing this for now. She will let me know if she changes her mind.  Other orders as above. Follow up in 1 yr or prn. The patient voiced understanding and agreement to the plan.  Jilda Roche [place], DO 08/09/22 8:49 AM

## 2022-08-11 ENCOUNTER — Telehealth (HOSPITAL_COMMUNITY): Payer: Self-pay

## 2022-08-11 NOTE — Telephone Encounter (Signed)
RTW form faxed to [employer], copy also emailed to patient and my chart message sent

## 2022-08-25 ENCOUNTER — Telehealth: Payer: Self-pay | Admitting: *Deleted

## 2022-08-25 NOTE — Telephone Encounter (Signed)
° °  [employer] auth approved  °

## 2022-09-08 ENCOUNTER — Telehealth (HOSPITAL_COMMUNITY): Payer: Self-pay | Admitting: *Deleted

## 2022-09-08 NOTE — Telephone Encounter (Signed)
Reaching out to patient to offer assistance regarding upcoming cardiac imaging study; pt verbalizes understanding of appt date/time, parking situation and where to check in, and verified current allergies; name and call back number provided for further questions should they arise ? ?Amber Klipfel RN Navigator Cardiac Imaging ?[facility] ?[phone] office ?[phone] cell ? ?Patient denies metal or claustrophobia. ?

## 2022-09-11 ENCOUNTER — Other Ambulatory Visit (HOSPITAL_COMMUNITY): Payer: Self-pay

## 2022-09-11 ENCOUNTER — Telehealth (HOSPITAL_COMMUNITY): Payer: Self-pay

## 2022-09-11 ENCOUNTER — Other Ambulatory Visit (HOSPITAL_COMMUNITY): Payer: Self-pay | Admitting: Pharmacist

## 2022-09-11 ENCOUNTER — Other Ambulatory Visit (HOSPITAL_COMMUNITY): Payer: Self-pay | Admitting: Cardiology

## 2022-09-11 ENCOUNTER — Ambulatory Visit (HOSPITAL_COMMUNITY)
Admission: RE | Admit: 2022-09-11 | Discharge: 2022-09-11 | Disposition: A | Discharge status: Home / Self Care | Payer: 59 | Source: Ambulatory Visit | Attending: Cardiology | Admitting: Cardiology

## 2022-09-11 DIAGNOSIS — I5022 Chronic systolic (congestive) heart failure: Secondary | ICD-10-CM | POA: Insufficient documentation

## 2022-09-11 MED ORDER — GADOBUTROL 1 MMOL/ML IV SOLN
10.0000 mL | Freq: Once | INTRAVENOUS | Status: AC | PRN
  Administered 2022-09-11: 10 mL via INTRAVENOUS

## 2022-09-11 MED ORDER — EMPAGLIFLOZIN 10 MG PO TABS: 10.0000 mg | ORAL_TABLET | Freq: Every day | ORAL | 3 refills | Status: DC

## 2022-09-11 NOTE — Telephone Encounter (Signed)
Advanced [facility] Patient Advocate Encounter  Returned messages from patient, this plan is requiring 90 day supply and prior authorization is on file and current. Spoke to pharmacy, new (90 day) prescription was processed and $10 copay was confirmed. Informed patient by phone.  Amber Stein, CPhT Rx Patient Advocate Phone: [phone]

## 2022-09-14 ENCOUNTER — Encounter (HOSPITAL_COMMUNITY): Payer: Self-pay

## 2022-09-20 ENCOUNTER — Other Ambulatory Visit (INDEPENDENT_AMBULATORY_CARE_PROVIDER_SITE_OTHER): Payer: 59

## 2022-09-20 DIAGNOSIS — E785 Hyperlipidemia, unspecified: Secondary | ICD-10-CM

## 2022-09-20 LAB — LIPID PANEL
Cholesterol: 222 mg/dL — ABNORMAL HIGH (ref 0–200)
HDL: 43 mg/dL (ref >39.00)
LDL Cholesterol: 151 mg/dL — ABNORMAL HIGH (ref 0–99)
NonHDL: 179.35
Total CHOL/HDL Ratio: 5
Triglycerides: 140 mg/dL (ref 0.0–149.0)
VLDL: 28 mg/dL (ref 0.0–40.0)

## 2022-11-20 ENCOUNTER — Telehealth (HOSPITAL_COMMUNITY): Payer: Self-pay

## 2022-11-20 NOTE — Telephone Encounter (Signed)
Paperwork sent to [employer] on 11/20/22 at [phone]

## 2022-11-23 ENCOUNTER — Encounter (HOSPITAL_COMMUNITY): Payer: Self-pay | Admitting: Cardiology

## 2022-11-23 ENCOUNTER — Ambulatory Visit (HOSPITAL_BASED_OUTPATIENT_CLINIC_OR_DEPARTMENT_OTHER)
Admission: RE | Admit: 2022-11-23 | Discharge: 2022-11-23 | Disposition: A | Discharge status: Home / Self Care | Payer: 59 | Source: Ambulatory Visit | Attending: Cardiology | Admitting: Cardiology

## 2022-11-23 ENCOUNTER — Ambulatory Visit (HOSPITAL_COMMUNITY)
Admission: RE | Admit: 2022-11-23 | Discharge: 2022-11-23 | Disposition: A | Discharge status: Home / Self Care | Payer: 59 | Source: Ambulatory Visit | Attending: Family Medicine | Admitting: Family Medicine

## 2022-11-23 VITALS — BP 104/70 | HR 75

## 2022-11-23 DIAGNOSIS — I5022 Chronic systolic (congestive) heart failure: Secondary | ICD-10-CM | POA: Diagnosis present

## 2022-11-23 DIAGNOSIS — I340 Nonrheumatic mitral (valve) insufficiency: Secondary | ICD-10-CM | POA: Diagnosis not present

## 2022-11-23 DIAGNOSIS — Z79899 Other long term (current) drug therapy: Secondary | ICD-10-CM | POA: Insufficient documentation

## 2022-11-23 DIAGNOSIS — K760 Fatty (change of) liver, not elsewhere classified: Secondary | ICD-10-CM | POA: Diagnosis not present

## 2022-11-23 DIAGNOSIS — I447 Left bundle-branch block, unspecified: Secondary | ICD-10-CM | POA: Insufficient documentation

## 2022-11-23 DIAGNOSIS — E785 Hyperlipidemia, unspecified: Secondary | ICD-10-CM | POA: Diagnosis not present

## 2022-11-23 DIAGNOSIS — Z7984 Long term (current) use of oral hypoglycemic drugs: Secondary | ICD-10-CM | POA: Diagnosis not present

## 2022-11-23 DIAGNOSIS — I428 Other cardiomyopathies: Secondary | ICD-10-CM | POA: Diagnosis not present

## 2022-11-23 DIAGNOSIS — E669 Obesity, unspecified: Secondary | ICD-10-CM | POA: Insufficient documentation

## 2022-11-23 LAB — HEMOGLOBIN A1C
Hgb A1c MFr Bld: 5.5 % (ref 4.8–5.6)
Mean Plasma Glucose: 111.15 mg/dL

## 2022-11-23 LAB — ECHOCARDIOGRAM COMPLETE
Area-P 1/2: 3.99 cm2
Calc EF: 35.6 %
S' Lateral: 3.8 cm
Single Plane A2C EF: 40 %
Single Plane A4C EF: 35.1 %

## 2022-11-23 LAB — COMPREHENSIVE METABOLIC PANEL
ALT: 22 U/L (ref 0–44)
AST: 16 U/L (ref 15–41)
Albumin: 4.2 g/dL (ref 3.5–5.0)
Alkaline Phosphatase: 53 U/L (ref 38–126)
Anion gap: 8 (ref 5–15)
BUN: 14 mg/dL (ref 6–20)
CO2: 26 mmol/L (ref 22–32)
Calcium: 9.2 mg/dL (ref 8.9–10.3)
Chloride: 102 mmol/L (ref 98–111)
Creatinine, Ser: 0.81 mg/dL (ref 0.44–1.00)
GFR, Estimated: >60 mL/min (ref >60)
Glucose, Bld: 109 mg/dL — ABNORMAL HIGH (ref 70–99)
Potassium: 4.6 mmol/L (ref 3.5–5.1)
Sodium: 136 mmol/L (ref 135–145)
Total Bilirubin: 0.5 mg/dL (ref 0.3–1.2)
Total Protein: 7.3 g/dL (ref 6.5–8.1)

## 2022-11-23 LAB — BRAIN NATRIURETIC PEPTIDE: B Natriuretic Peptide: 34.4 pg/mL (ref 0.0–100.0)

## 2022-11-23 MED ORDER — CARVEDILOL 6.25 MG PO TABS: 6.2500 mg | ORAL_TABLET | Freq: Two times a day (BID) | ORAL | 5 refills | Status: DC

## 2022-11-23 MED ORDER — ENTRESTO 24-26 MG PO TABS: 1.0000 | ORAL_TABLET | Freq: Two times a day (BID) | ORAL | 1 refills | Status: DC

## 2022-11-23 NOTE — Progress Notes (Signed)
PCP: Sharlene Dory, DO Cardiology: Dr. Tenny Craw HF Cardiology: Dr. Shirlee Latch  42 y.o. with history of cardiomyopathy was referred by Dr. Tenny Craw for evaluation of CHF.  In 12/23, patient developed abdominal pain, nausea, vomiting, and diarrhea. RUQ US showed fatty liver.  She does not drink ETOH.  She was thought to have NAFLD.  She also noted the development of significant exertional dyspnea with most activities, even just walking around the house.  Echo was done in 1/24 showing EF 20-25%, severe functional MR, mild RV dysfunction. ECG showed LBBB.  She was started on Lasix for diuresis.  RHC/LHC in 1/24 showed no CAD, elevated filling pressures and low but not markedly low cardiac output.     Cardiac MRI in 5/24 showed EF 35%, septal-lateral dyssynchrony, RV EF 31%, no LGE, moderate MR with regurgitant fraction 26%. Echo was done today and reviewed, EF 35-40%, septal-lateral dyssynchrony, normal RV, mild-moderate MR.   Today she returns for HF follow up. No significant exertional dyspnea though she will get more fatigued than in the past by the end of the workday.  She is back at work as a Teacher, early years/pre.  No orthopnea/PND.  No chest pain.  Rare lightheadedness if she stands too fast. Weight is up 19 lbs.   ECG (personally reviewed): NSR, LBBB 154 msec  Labs (2/24): Pro-BNP 1519, K 3.7, creatinine 0.93 Labs (3/24): K 4.6, creatinine 0.83, AST 42, ALT 50 Labs (4/24): BNP 43, K 4.3, creatinine 0.83 Labs (5/24): LDL 151  PMH:  1. LBBB 2. Chronic systolic CHF: Echo (1/24) with EF 20-25%, severe functional MR, mild RV dysfunction.  - LHC/RHC (1/24): No significant CAD; mean RA 15, PA 58/21 mean 37, mean PCWP 26, CI 2.18, PVR 2.3 WU, PAPi 2.5.  - Cardiac MRI in 5/24: EF 35%, septal-lateral dyssynchrony, RV EF 31%, no LGE, moderate MR with regurgitant fraction 26%.  - Echo (7/24): EF 35-40%, septal-lateral dyssynchrony, normal RV, mild-moderate MR.  3. Mitral regurgitation: Suspect functional,  severe on 1/24 echo.  4. NAFLD: Chronic elevated LFTs.  - Abdominal US (1/24): Fatty liver.   SH: Married, lives in [place], works as Teacher, early years/pre, no drugs, no ETOH, no smoking.   FH: No family history of CHF or sudden death.   ROS: All systems reviewed and negative except as per HPI.   Current Outpatient Medications  Medication Sig Dispense Refill   acetaminophen (TYLENOL) 500 MG tablet Take 500 mg by mouth every 6 (six) hours as needed for headache, moderate pain or mild pain.     cetirizine (ZYRTEC) 10 MG tablet Take 10 mg by mouth daily.     empagliflozin (JARDIANCE) 10 MG TABS tablet Take 1 tablet (10 mg total) by mouth daily before breakfast. 90 tablet 3   furosemide (LASIX) 40 MG tablet Take 80 mg by mouth daily.     potassium chloride SA (KLOR-CON M) 20 MEQ tablet Take 1 tablet (20 mEq total) by mouth daily. 90 tablet 3   spironolactone (ALDACTONE) 25 MG tablet Take 1 tablet (25 mg total) by mouth daily. 90 tablet 3   carvedilol (COREG) 6.25 MG tablet Take 1 tablet (6.25 mg total) by mouth 2 (two) times daily with a meal. 60 tablet 5   sacubitril-valsartan (ENTRESTO) 24-26 MG Take 1 tablet by mouth 2 (two) times daily. 180 tablet 1   No current facility-administered medications for this encounter.   Wt Readings from Last 3 Encounters:  08/09/22 106.7 kg (235 lb 2 oz)  08/07/22 105.7 kg (  233 lb)  07/13/22 101.2 kg (223 lb)    BP 104/70   Pulse 75   SpO2 99%  General: NAD Neck: No JVD, no thyromegaly or thyroid nodule.  Lungs: Clear to auscultation bilaterally with normal respiratory effort. CV: Nondisplaced PMI.  Heart regular S1/S2, no S3/S4, no murmur.  No peripheral edema.  No carotid bruit.  Normal pedal pulses.  Abdomen: Soft, nontender, no hepatosplenomegaly, no distention.  Skin: Intact without lesions or rashes.  Neurologic: Alert and oriented x 3.  Psych: Normal affect. Extremities: No clubbing or cyanosis.  HEENT: Normal.   Assessment/Plan: 1.  Chronic systolic CHF: Nonischemic cardiomyopathy.  Echo in 1/24 showed EF 20-25%, severe functional MR, mild RV dysfunction. ECG showed LBBB.  RHC/LHC in 1/24 showed no CAD, elevated filling pressures and low but not markedly low cardiac output. No ETOH or drug history.  TSH normal.  No family history of cardiomyopathy or sudden death.  ?Viral myocarditis as etiology or LBBB cardiomyopathy.  Cardiac MRI in 5/24 showed EF 35%, septal-lateral dyssynchrony, RV EF 31%, no LGE, moderate MR with regurgitant fraction 26%. Echo was done today and reviewed, EF 35-40%, septal-lateral dyssynchrony, normal RV, mild-moderate MR. Stable NYHA class II.  She is not volume overloaded on exam but weight is up.  - Increase Coreg to 6.25 mg bid.  - Continue Entresto 24/26 bid.  - Continue Lasix 80 mg daily + 20 KCL daily. BMET and BNP today. - Continue spironolactone 25 mg daily. - Continue Jardiance 10 mg daily.  - EF appears just out of range for CRT-D.  Will continue titrating meds, repeat cardiac MRI early next year.  2. Mitral regurgitation: Severe on 1/24 echo though she does not have a loud murmur.  Suspect functional.  Echo today showed mild-moderate MR.  3. Elevated LFTs: Fatty liver on [place], suspect NAFLD.  Congestive hepatopathy likely plays a role.  - CMET today.  4. Hyperlipidemia: LDL is quite high, will need to discuss statin.  5. Obesity: I will refer her to pharmacy clinic to see if she can get on a GLP-1 agonist.  Will check hgbA1c today.   Followup HF pharmacist in 1 month for med titration, see APP in 3 months.   Marca Ancona  11/23/2022

## 2022-11-23 NOTE — Patient Instructions (Signed)
Medication Changes:  INCREASE CARVEDILOL TO 6.25MG  TWICE DAILY   Lab Work:  Labs done today, your results will be available in MyChart, we will contact you for abnormal readings.  Referrals:  YOU HAVE BEEN REFERRED TO PHARMD AT [facility] THEY WILL REACH OUT TO YOU OR CALL TO ARRANGE THIS. PLEASE CALL us WITH ANY CONCERNS   Follow-Up in: 1 MONTH AS SCHEDULED WITH PHARMACIST HERE AT THE HF CLINIC   AND THEN 3 MONTHS AS SCHEDULED WITH APP   At the [facility], you and your health needs are our priority. We have a designated team specialized in the treatment of Heart Failure. This Care Team includes your primary Heart Failure Specialized Cardiologist (physician), Advanced Practice Providers (APPs- Physician Assistants and Nurse Practitioners), and Pharmacist who all work together to provide you with the care you need, when you need it.   You may see any of the following providers on your designated Care Team at your next follow up:  Dr. Arvilla Meres Dr. Marca Ancona Dr. Marcos Eke, NP Robbie Lis, [place] [facility] [place], [place] Brynda Peon, NP Karle Plumber, PharmD   Please be sure to bring in all your medications bottles to every appointment.   Need to Contact us:  If you have any questions or concerns before your next appointment please send [place] a message through [place] or call our office at [phone].    TO LEAVE A MESSAGE FOR THE NURSE SELECT OPTION 2, PLEASE LEAVE A MESSAGE INCLUDING: YOUR NAME DATE OF BIRTH CALL BACK NUMBER REASON FOR CALL**this is important as we prioritize the call backs  YOU WILL RECEIVE A CALL BACK THE SAME DAY AS LONG AS YOU CALL BEFORE 4:00 PM

## 2022-11-23 NOTE — Progress Notes (Signed)
Echocardiogram 2D Echocardiogram has been performed.  Augustine Radar 11/23/2022, 8:46 AM

## 2022-12-15 ENCOUNTER — Encounter: Payer: Self-pay | Admitting: Pharmacist

## 2022-12-15 ENCOUNTER — Other Ambulatory Visit (HOSPITAL_COMMUNITY): Payer: Self-pay

## 2022-12-15 ENCOUNTER — Ambulatory Visit: Payer: 59 | Attending: Cardiology | Admitting: Pharmacist

## 2022-12-15 ENCOUNTER — Telehealth: Payer: Self-pay | Admitting: Pharmacist

## 2022-12-15 VITALS — Ht 63.0 in | Wt 256.6 lb

## 2022-12-15 DIAGNOSIS — I340 Nonrheumatic mitral (valve) insufficiency: Secondary | ICD-10-CM

## 2022-12-15 DIAGNOSIS — I5023 Acute on chronic systolic (congestive) heart failure: Secondary | ICD-10-CM | POA: Diagnosis not present

## 2022-12-15 DIAGNOSIS — I5022 Chronic systolic (congestive) heart failure: Secondary | ICD-10-CM

## 2022-12-15 DIAGNOSIS — Z6841 Body Mass Index (BMI) 40.0 and over, adult: Secondary | ICD-10-CM

## 2022-12-15 NOTE — Telephone Encounter (Signed)
PA for [facility] submitted.  Key: [id number] Result: Your patient will pay 100% of a discounted price for this medication. Any amount the patient pays will not apply to their deductible or out-of-pocket expenses   PA for Zepbound submitted: Key: [id number]. Result: Message from [employer]: Drug is not covered by plan

## 2022-12-15 NOTE — Progress Notes (Unsigned)
Patient ID: Kitt Course                 DOB: 1980-08-10                    MRN: [id number]     HPI: Amber Stein is a 42 y.o. female patient referred to pharmacy clinic by Dr Shirlee Latch to initiate GLP1-RA therapy. PMH is significant for CHF and obesity. Most recent BMI 45.47.  Patient presents today to discuss weight loss. 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  Eats yogurt and granola for breakfast and a salad for lunch. Typically has a protein and 2 vegetables for dinner. Biggest weakness is snacking on sweets after dinner which she is going to try to work on.   Labs: Lab Results  Component Value Date   HGBA1C 5.5 11/23/2022    Wt Readings from Last 1 Encounters:  08/09/22 235 lb 2 oz (106.7 kg)    BP Readings from Last 1 Encounters:  11/23/22 104/70   Pulse Readings from Last 1 Encounters:  11/23/22 75       Component Value Date/Time   CHOL 222 (H) 09/20/2022 0801   CHOL 200 (H) 02/28/2019 1022   TRIG 140.0 09/20/2022 0801   HDL 43.00 09/20/2022 0801   HDL 37 (L) 02/28/2019 1022   CHOLHDL 5 09/20/2022 0801   VLDL 28.0 09/20/2022 0801   LDLCALC 151 (H) 09/20/2022 0801   LDLCALC 135 (H) 02/28/2019 1022    Past Medical History:  Diagnosis Date   NAFLD (nonalcoholic fatty liver disease)     Current Outpatient Medications on File Prior to Visit  Medication Sig Dispense Refill   acetaminophen (TYLENOL) 500 MG tablet Take 500 mg by mouth every 6 (six) hours as needed for headache, moderate pain or mild pain.     carvedilol (COREG) 6.25 MG tablet Take 1 tablet (6.25 mg total) by mouth 2 (two) times daily with a meal. 60 tablet 5   cetirizine (ZYRTEC) 10 MG tablet Take 10 mg by mouth daily.     empagliflozin (JARDIANCE) 10 MG TABS tablet Take 1 tablet (10 mg total) by mouth daily before breakfast. 90 tablet 3   furosemide (LASIX) 40 MG tablet Take 80 mg by mouth daily.     potassium chloride SA (KLOR-CON M) 20 MEQ tablet Take 1 tablet (20 mEq  total) by mouth daily. 90 tablet 3   sacubitril-valsartan (ENTRESTO) 24-26 MG Take 1 tablet by mouth 2 (two) times daily. 180 tablet 1   spironolactone (ALDACTONE) 25 MG tablet Take 1 tablet (25 mg total) by mouth daily. 90 tablet 3   No current facility-administered medications on file prior to visit.    No Known Allergies   Assessment/Plan:  1. Weight loss - [place] current BMI 45.47 indicating Class 3 obesity. Would beneift from GLP1a. Discussed pathophysiology of weight gain and the role of diet, exercise, and medications in controlling weight gain.  Using [employer] and [employer], educated patient on mechanism of action, storage, site selection and administration. Confirmed patient not pregnant and no personal or family history of medullary thyroid carcinoma (MTC) or Multiple Endocrine Neoplasia syndrome type 2 (MEN 2). Injection technique reviewed at today's visit.  Advised patient on common side effects including nausea, diarrhea, dyspepsia, decreased appetite, and fatigue. Counseled patient on reducing meal size and how to titrate medication to minimize side effects. Counseled patient to call if intolerable side effects or if experiencing dehydration, abdominal pain, or  dizziness. Patient will adhere to dietary modifications and will target at least 150 minutes of moderate intensity exercise weekly.   Will complete prior authorization for both products and contact patient with result. Patient voiced understanding.  Start Wegovy/Zepbound q week Recheck in 4 weeks  Laural Golden, PharmD, [place], CDCES, CPP [address] [place], [place], [address] Phone: [phone], Fax: [phone]

## 2022-12-15 NOTE — Patient Instructions (Signed)
It was nice meeting you today  The medications we discussed today are called Wegovy and Zepbound, both of which are injected once weekly  I will complete the prior authorizations for both and contact you with the results  If approved I will let you know and send it in to your pharmacy  Please let me know if you have any questions  Laural Golden, PharmD, BCACP, CDCES, CPP [address] [place], [place], [address] Phone: [phone], Fax: [phone]

## 2022-12-22 NOTE — Progress Notes (Incomplete)
***In Progress***    Advanced Heart Failure Clinic Note  PCP: Sharlene Dory, DO Cardiology: Dr. Tenny Craw HF Cardiology: Dr. Shirlee Latch HPI:   42 y.o. female with history of cardiomyopathy was referred by Dr. Tenny Craw for evaluation of CHF.  In 03/2022, patient developed abdominal pain, nausea, vomiting, and diarrhea. RUQ US showed fatty liver.  She does not drink ETOH.  She was thought to have NAFLD.  She also noted the development of significant exertional dyspnea with most activities, even just walking around the house.  Echo was done in 05/2022 showing EF 20-25%, severe functional MR, mild RV dysfunction. ECG showed LBBB.  She was started on Lasix for diuresis.  RHC/LHC in 05/2022 showed no CAD, elevated filling pressures and low but not markedly low cardiac output.      Cardiac MRI in 08/2022 showed EF 35%, septal-lateral dyssynchrony, RV EF 31%, no LGE, moderate MR with regurgitant fraction 26%. Echo was completed and reviewed, EF 35-40%, septal-lateral dyssynchrony, normal RV, mild-moderate MR.    At last visit she returned for HF follow up. No significant exertional dyspnea though she got more fatigued than in the past by the end of the workday.  She is back at work as a Teacher, early years/pre.  Denied orthopnea/PND.  Denied chest pain.  Rare lightheadedness if she stands too fast. Weight is up 19 lbs.   Today she returns to HF clinic for pharmacist medication titration. At last visit with Dr. Shirlee Latch on 11/23/22 carvedilol was increased to 6.25 mg bid.  Overall feeling ***. Dizziness, lightheadedness, fatigue:  Chest pain or palpitations:  How is your breathing?: *** SOB: Able to complete all ADLs. Activity level ***  Weight at home pounds. Takes furosemide/torsemide/bumex *** mg *** daily.  LEE PND/Orthopnea  Appetite *** Low-salt diet:   Physical Exam Cost/affordability of meds   HF Medications: carvedilol 6.25 mg bid Entresto 24/26 mg bid spironolactone 25 mg daily Jardiance  10 mg daily furosemide 80 mg daily  Has the patient been experiencing any side effects to the medications prescribed?  {YES NO:22349}  Does the patient have any problems obtaining medications due to transportation or finances?   {YES NO:22349} [employer] commercial Understanding of regimen: {excellent/good/fair/poor:19665} Understanding of indications: {excellent/good/fair/poor:19665} Potential of compliance: {excellent/good/fair/poor:19665} Patient understands to avoid NSAIDs. Patient understands to avoid decongestants.    Pertinent Lab Values: Labs 11/23/22: Serum creatinine 0.81, BUN 14, Potassium 4.6, Sodium 136  Vital Signs: Weight: *** (last clinic weight: 256 lbs) Blood pressure: ***  Heart rate: ***   Assessment/Plan: *** 1. Chronic systolic CHF: Nonischemic cardiomyopathy.  Echo in 1/24 showed EF 20-25%, severe functional MR, mild RV dysfunction. ECG showed LBBB.  RHC/LHC in 1/24 showed no CAD, elevated filling pressures and low but not markedly low cardiac output. No ETOH or drug history.  TSH normal.  No family history of cardiomyopathy or sudden death.  ?Viral myocarditis as etiology or LBBB cardiomyopathy.  Cardiac MRI in 5/24 showed EF 35%, septal-lateral dyssynchrony, RV EF 31%, no LGE, moderate MR with regurgitant fraction 26%. Echo was done today and reviewed, EF 35-40%, septal-lateral dyssynchrony, normal RV, mild-moderate MR. Stable NYHA class II.  She is not volume overloaded on exam but weight is up.  - Increase Coreg to 6.25 mg bid.  - Continue Entresto 24/26 bid.  - Continue Lasix 80 mg daily + 20 KCL daily. BMET and BNP today. - Continue spironolactone 25 mg daily. - Continue Jardiance 10 mg daily.  - EF appears just out of range for  CRT-D.  Will continue titrating meds, repeat cardiac MRI early next year.  2. Mitral regurgitation: Severe on 1/24 echo though she does not have a loud murmur.  Suspect functional.  Echo today showed mild-moderate MR.  3. Elevated LFTs:  Fatty liver on [place], suspect NAFLD.  Congestive hepatopathy likely plays a role.  - CMET today.  4. Hyperlipidemia: LDL is quite high, will need to discuss statin.  5. Obesity: I will refer her to pharmacy clinic to see if she can get on a GLP-1 agonist.  Will check hgbA1c today.   Follow up 02/23/23 with NP/PA   Karle Plumber, PharmD, BCPS, BCCP, CPP Heart Failure Clinic Pharmacist [phone]

## 2022-12-27 ENCOUNTER — Inpatient Hospital Stay (HOSPITAL_COMMUNITY): Admission: RE | Admit: 2022-12-27 | Payer: 59 | Source: Ambulatory Visit

## 2023-01-03 ENCOUNTER — Encounter (HOSPITAL_COMMUNITY): Payer: Self-pay

## 2023-01-04 NOTE — Telephone Encounter (Signed)
@  DR Shirlee Latch Would you like to extended work restrictions. Last follow up 7/25 (see note) Advised to follow up x 3 months   Current restrictions  She may return to work, 2-3 10 hour shifts, alternating every other day, with lifting restrictions of no more than 20 lbs. She should be afforded rest breaks as needed. We can reassess after she has further testing in July/Aug.

## 2023-01-30 NOTE — Progress Notes (Signed)
Advanced Heart Failure Clinic Note   PCP: Sharlene Dory, DO Cardiology: Dr. Tenny Craw HF Cardiology: Dr. Shirlee Latch   HPI:  42 y.o. with history of cardiomyopathy was referred by Dr. Tenny Craw for evaluation of CHF. In 03/2022, patient developed abdominal pain, nausea, vomiting, and diarrhea. RUQ US showed fatty liver.  She does not drink ETOH.  She was thought to have NAFLD. She also noted the development of significant exertional dyspnea with most activities, even just walking around the house.  Echo was done in 05/2022 showing EF 20-25%, severe functional MR, mild RV dysfunction. ECG showed LBBB. She was started on Lasix for diuresis.  RHC/LHC in 05/2022 showed no CAD, elevated filling pressures and low but not markedly low cardiac output.      Cardiac MRI in 08/2022 showed EF 35%, septal-lateral dyssynchrony, RV EF 31%, no LGE, moderate MR with regurgitant fraction 26%. Echo 10/2022, EF 35-40%, septal-lateral dyssynchrony, normal RV, mild-moderate MR.    Recently presented to AHF Clinic for HF follow up 11/23/22. Reported no significant exertional dyspnea though she stated she did get more fatigued than in the past by the end of the workday.  She was back at work as a Teacher, early years/pre.  No orthopnea/PND.  No chest pain.  Reported rare lightheadedness if she stands too fast. Weight was up 19 lbs.  Today she returns to HF clinic for pharmacist medication titration. At last visit with MD, carvedilol was increased to 6.25 mg BID. Overall feeling ok today. Continues to report some lightheadedness when standing too fast and bending over. These episodes only last a few seconds, then resolve. SBP at home is 112-120 mmHg. BP in clinic 120/84 mmHg. No CP/palpitations. Mentioned recent heartburn episodes and recommended Pepcid OTC. Reports some fatigue. No SOB/DOE. Able to complete all ADLs. She checks her weight every other day at home and it fluctuates around 261 lbs. Weight is up 6 lbs from last clinic  visit but this does not appear to be from extra fluid. She takes Lasix 80 mg daily and an extra 40 mg tablet on days she works (Tues/Thurs/Sat) due to increased water intake. No LEE, PND or orthopnea. Appetite has been good. She watches the amount of salt in her diet.   HF Medications: Carvedilol 6.25 mg BID Entresto 24/26 mg BID Spironolactone 25 mg daily Jardiance 10 mg daily Lasix 80 mg daily  Has the patient been experiencing any side effects to the medications prescribed?  no  Does the patient have any problems obtaining medications due to transportation or finances?   No; [employer] Nurse, learning disability  Understanding of regimen: good Understanding of indications: good Potential of compliance: good Patient understands to avoid NSAIDs. Patient understands to avoid decongestants.    Pertinent Lab Values: 11/23/22: Serum creatinine 0.81, BUN 14, Potassium 4.6, Sodium 136, BNP 34.4  Vital Signs: Weight: 263 lbs (last clinic weight: 256.6 lbs) Blood pressure: 120/84  Heart rate: 70   Assessment/Plan: 1. Chronic systolic CHF: Nonischemic cardiomyopathy.  Echo in 05/2022 showed EF 20-25%, severe functional MR, mild RV dysfunction. ECG showed LBBB.  RHC/LHC in 05/2022 showed no CAD, elevated filling pressures and low but not markedly low cardiac output. No ETOH or drug history. TSH normal. No family history of cardiomyopathy or sudden death. ?Viral myocarditis as etiology or LBBB cardiomyopathy.  Cardiac MRI in 08/2022 showed EF 35%, septal-lateral dyssynchrony, RV EF 31%, no LGE, moderate MR with regurgitant fraction 26%. Echo 10/2022, EF 35-40%, septal-lateral dyssynchrony, normal RV, mild-moderate MR.  -  Stable NYHA class II. She is not volume overloaded on exam but weight is up.  - Continue Lasix 80 mg daily + 20 mEq KCL daily. - Continue carvedilol 6.25 mg BID.  - Increase Entresto to 49/51 mg BID. Repeat BMET in 3 weeks at follow up.  - Continue spironolactone 25 mg daily. - Continue  Jardiance 10 mg daily.  - EF appears just out of range for CRT-D. Will continue titrating meds, repeat cardiac MRI early next year.  2. Mitral regurgitation: Severe on 05/2022 echo though she does not have a loud murmur. Suspect functional.  Echo 10/2022 showed mild-moderate MR.  3. Elevated LFTs: Fatty liver on [place], suspect NAFLD. Congestive hepatopathy likely plays a role.  4. Hyperlipidemia: LDL is quite high, will need to discuss statin at next visit.  5. Obesity: Previously referred to pharmacy clinic to see if she can get on a GLP-1 agonist. Unfortunately, cost is prohibitive. Mentioned open-enrollment at work and may change insurance plan to one that is willing to cover.   Follow up [facility] at [facility]   Karle Plumber, PharmD, BCPS, BCCP, CPP Heart Failure Clinic Pharmacist [phone]

## 2023-01-31 ENCOUNTER — Ambulatory Visit (HOSPITAL_COMMUNITY)
Admission: RE | Admit: 2023-01-31 | Discharge: 2023-01-31 | Disposition: A | Discharge status: Home / Self Care | Payer: 59 | Source: Ambulatory Visit | Attending: Cardiology | Admitting: Cardiology

## 2023-01-31 VITALS — BP 120/84 | HR 70 | Wt 263.0 lb

## 2023-01-31 DIAGNOSIS — Z79899 Other long term (current) drug therapy: Secondary | ICD-10-CM | POA: Insufficient documentation

## 2023-01-31 DIAGNOSIS — K760 Fatty (change of) liver, not elsewhere classified: Secondary | ICD-10-CM | POA: Insufficient documentation

## 2023-01-31 DIAGNOSIS — I428 Other cardiomyopathies: Secondary | ICD-10-CM | POA: Insufficient documentation

## 2023-01-31 DIAGNOSIS — I5022 Chronic systolic (congestive) heart failure: Secondary | ICD-10-CM | POA: Insufficient documentation

## 2023-01-31 DIAGNOSIS — R42 Dizziness and giddiness: Secondary | ICD-10-CM | POA: Insufficient documentation

## 2023-01-31 DIAGNOSIS — I340 Nonrheumatic mitral (valve) insufficiency: Secondary | ICD-10-CM | POA: Insufficient documentation

## 2023-01-31 DIAGNOSIS — Z7984 Long term (current) use of oral hypoglycemic drugs: Secondary | ICD-10-CM | POA: Diagnosis not present

## 2023-01-31 DIAGNOSIS — I447 Left bundle-branch block, unspecified: Secondary | ICD-10-CM | POA: Diagnosis not present

## 2023-01-31 DIAGNOSIS — R7989 Other specified abnormal findings of blood chemistry: Secondary | ICD-10-CM | POA: Insufficient documentation

## 2023-01-31 DIAGNOSIS — E785 Hyperlipidemia, unspecified: Secondary | ICD-10-CM | POA: Insufficient documentation

## 2023-01-31 DIAGNOSIS — E669 Obesity, unspecified: Secondary | ICD-10-CM | POA: Diagnosis not present

## 2023-01-31 MED ORDER — ENTRESTO 49-51 MG PO TABS: 1.0000 | ORAL_TABLET | Freq: Two times a day (BID) | ORAL | 3 refills | Status: DC

## 2023-01-31 NOTE — Patient Instructions (Signed)
It was a pleasure seeing you today!  MEDICATIONS: -We are changing your medications today -Increase Entresto to 49/51 mg (1 tablet) twice daily. Please take 1.5 tablets of the Entresto 24/26 mg strength twice daily for 1 week, then increase to the 49/51 mg twice daily dose if tolerated.  -Call if you have questions about your medications.  NEXT APPOINTMENT: Return to clinic in 3 weeks with [facility].  In general, to take care of your heart failure: -Limit your fluid intake to 2 Liters (half-gallon) per day.   -Limit your salt intake to ideally 2-3 grams (2000-3000 mg) per day. -Weigh yourself daily and record, and bring that "weight diary" to your next appointment.  (Weight gain of 2-3 pounds in 1 day typically means fluid weight.) -The medications for your heart are to help your heart and help you live longer.   -Please contact us before stopping any of your heart medications.  Call the clinic at [phone] with questions or to reschedule future appointments.

## 2023-02-20 NOTE — Progress Notes (Signed)
PCP: Sharlene Dory, DO Cardiology: Dr. Tenny Craw HF Cardiology: Dr. Shirlee Latch  42 y.o. with history of cardiomyopathy was referred by Dr. Tenny Craw for evaluation of CHF.  In 12/23, patient developed abdominal pain, nausea, vomiting, and diarrhea. RUQ US showed fatty liver.  She does not drink ETOH.  She was thought to have NAFLD.  She also noted the development of significant exertional dyspnea with most activities, even just walking around the house.  Echo was done in 1/24 showing EF 20-25%, severe functional MR, mild RV dysfunction. ECG showed LBBB.  She was started on Lasix for diuresis.  RHC/LHC in 1/24 showed no CAD, elevated filling pressures and low but not markedly low cardiac output.     Cardiac MRI in 5/24 showed EF 35%, septal-lateral dyssynchrony, RV EF 31%, no LGE, moderate MR with regurgitant fraction 26%. Echo was done today and reviewed, EF 35-40%, septal-lateral dyssynchrony, normal RV, mild-moderate MR.   Today she returns for HF follow up. Overall feeling fine, but has noticed more fatigue. Requires more breaks at work.  She has SOB when she is very active. She has dizziness bending over. Has some LE swelling after being on feet all day at work, wears compression hose. Has noticed HR variability, up to 130s. Denies CP, abnormal bleeding or PND/Orthopnea. Appetite ok. No fever or chills. Weight at home 262 pounds. Taking all medications. BP at home 110/70s. Works as Teacher, early years/pre.  ECG (personally reviewed): none ordered today.  Labs (2/24): Pro-BNP 1519, K 3.7, creatinine 0.93 Labs (3/24): K 4.6, creatinine 0.83, AST 42, ALT 50 Labs (4/24): BNP 43, K 4.3, creatinine 0.83 Labs (5/24): LDL 151 Labs (7/24): K 4.6, creatinine 0.81, normal LFTs  PMH:  1. LBBB 2. Chronic systolic CHF: Echo (1/24) with EF 20-25%, severe functional MR, mild RV dysfunction.  - LHC/RHC (1/24): No significant CAD; mean RA 15, PA 58/21 mean 37, mean PCWP 26, CI 2.18, PVR 2.3 WU, PAPi 2.5.  - Cardiac  MRI in 5/24: EF 35%, septal-lateral dyssynchrony, RV EF 31%, no LGE, moderate MR with regurgitant fraction 26%.  - Echo (7/24): EF 35-40%, septal-lateral dyssynchrony, normal RV, mild-moderate MR.  3. Mitral regurgitation: Suspect functional, severe on 1/24 echo.  4. NAFLD: Chronic elevated LFTs.  - Abdominal US (1/24): Fatty liver.   SH: Married, lives in [place], works as Teacher, early years/pre, no drugs, no ETOH, no smoking.   FH: No family history of CHF or sudden death.   ROS: All systems reviewed and negative except as per HPI.   Current Outpatient Medications  Medication Sig Dispense Refill   acetaminophen (TYLENOL) 500 MG tablet Take 500 mg by mouth every 6 (six) hours as needed for headache, moderate pain or mild pain.     carvedilol (COREG) 6.25 MG tablet Take 1 tablet (6.25 mg total) by mouth 2 (two) times daily with a meal. 60 tablet 5   cetirizine (ZYRTEC) 10 MG tablet Take 10 mg by mouth daily.     empagliflozin (JARDIANCE) 10 MG TABS tablet Take 1 tablet (10 mg total) by mouth daily before breakfast. 90 tablet 3   furosemide (LASIX) 40 MG tablet Take 80 mg by mouth daily.     potassium chloride SA (KLOR-CON M) 20 MEQ tablet Take 1 tablet (20 mEq total) by mouth daily. 90 tablet 3   sacubitril-valsartan (ENTRESTO) 49-51 MG Take 1 tablet by mouth 2 (two) times daily. 180 tablet 3   spironolactone (ALDACTONE) 25 MG tablet Take 1 tablet (25 mg total) by mouth  daily. 90 tablet 3   No current facility-administered medications for this encounter.   Wt Readings from Last 3 Encounters:  02/23/23 118.8 kg (262 lb)  01/31/23 119.3 kg (263 lb)  12/15/22 116.4 kg (256 lb 9.6 oz)   BP 110/74   Pulse 84   Wt 118.8 kg (262 lb)   SpO2 97%   BMI 46.41 kg/m  Physical Exam General:  NAD. No resp difficulty, walked into clinic HEENT: Normal Neck: Supple. No JVD, thick neck. Carotids 2+ bilat; no bruits. No lymphadenopathy or thryomegaly appreciated. Cor: PMI nondisplaced. Regular  rate & rhythm. No rubs, gallops or murmurs. Lungs: Clear Abdomen: Soft, obese, nontender, nondistended. No hepatosplenomegaly. No bruits or masses. Good bowel sounds. Extremities: No cyanosis, clubbing, rash, edema Neuro: Alert & oriented x 3, cranial nerves grossly intact. Moves all 4 extremities w/o difficulty. Affect pleasant.  Assessment/Plan: 1. Chronic systolic CHF: Nonischemic cardiomyopathy.  Echo in 1/24 showed EF 20-25%, severe functional MR, mild RV dysfunction. ECG showed LBBB.  RHC/LHC in 1/24 showed no CAD, elevated filling pressures and low but not markedly low cardiac output. No ETOH or drug history.  TSH normal.  No family history of cardiomyopathy or sudden death.  ?Viral myocarditis as etiology or LBBB cardiomyopathy.  Cardiac MRI in 5/24 showed EF 35%, septal-lateral dyssynchrony, RV EF 31%, no LGE, moderate MR with regurgitant fraction 26%. Echo 7/24 showed EF 35-40%, septal-lateral dyssynchrony, normal RV, mild-moderate MR. Stable NYHA class II.  She is not volume overloaded on exam but weight is up.  - Continue Coreg 6.25 mg bid.  - Continue Entresto 49/51 mg bid. BMET and BNP today - Continue Lasix 80 mg daily + 20 KCL daily. - Continue spironolactone 25 mg daily. - Continue Jardiance 10 mg daily. No GU symptoms. No GU symptoms - EF appears just out of range for CRT-D.  Repeat cardiac MRI early next year (05/2023). - She is not on contraception, discussed feto-toxicity of GDMT.  2. Mitral regurgitation: Severe on 1/24 echo though she does not have a loud murmur.  Suspect functional.  Echo 7/24 showed mild-moderate MR.  3. Elevated LFTs: Fatty liver on [place], suspect NAFLD.  Congestive hepatopathy likely plays a role.  - Most recent LFTs normalized. She does not drink a significant amount of ETOH or regularly use acetaminophen 4. Hyperlipidemia: LDL 151 (5/24). Discussed dietary and lifestyle changes, but suspect this alone will not give her a meaningful reduction in LDL.  -  Start rosuvastatin 5 mg daily for primary prevention. LFTs and lipids in 6-8 weeks.  5. Obesity: Body mass index is 46.41 kg/m. - Insurance will not cover GLP-1 agonist. HgbA1c 5.5  6. Elevated HR/palpitations: smart watch has alerted her to elevated HRs, this is distressing her. No snoring. - Will not increase beta blocker today with fatigue. - Place 7 day Zio. - Check TSH. - Consider sleep study next.  Followup in 4 months with Dr. Shirlee Latch.   She was given a note for her employer today to continue current work schedule. Can re-evaluate at follow up with Dr. Kathreen Cornfield [facility] FNP-BC 02/23/2023

## 2023-02-23 ENCOUNTER — Encounter (HOSPITAL_COMMUNITY): Payer: Self-pay

## 2023-02-23 ENCOUNTER — Ambulatory Visit (HOSPITAL_COMMUNITY)
Admission: RE | Admit: 2023-02-23 | Discharge: 2023-02-23 | Disposition: A | Discharge status: Home / Self Care | Payer: 59 | Source: Ambulatory Visit | Attending: Family Medicine

## 2023-02-23 ENCOUNTER — Inpatient Hospital Stay (HOSPITAL_COMMUNITY)
Admission: RE | Admit: 2023-02-23 | Discharge: 2023-02-23 | Disposition: A | Discharge status: Home / Self Care | Payer: 59 | Source: Ambulatory Visit

## 2023-02-23 VITALS — BP 110/74 | HR 84 | Wt 262.0 lb

## 2023-02-23 DIAGNOSIS — R002 Palpitations: Secondary | ICD-10-CM

## 2023-02-23 DIAGNOSIS — Z7984 Long term (current) use of oral hypoglycemic drugs: Secondary | ICD-10-CM | POA: Insufficient documentation

## 2023-02-23 DIAGNOSIS — Z79899 Other long term (current) drug therapy: Secondary | ICD-10-CM | POA: Insufficient documentation

## 2023-02-23 DIAGNOSIS — K761 Chronic passive congestion of liver: Secondary | ICD-10-CM | POA: Diagnosis not present

## 2023-02-23 DIAGNOSIS — K760 Fatty (change of) liver, not elsewhere classified: Secondary | ICD-10-CM | POA: Diagnosis not present

## 2023-02-23 DIAGNOSIS — Z6841 Body Mass Index (BMI) 40.0 and over, adult: Secondary | ICD-10-CM | POA: Insufficient documentation

## 2023-02-23 DIAGNOSIS — I5022 Chronic systolic (congestive) heart failure: Secondary | ICD-10-CM | POA: Diagnosis present

## 2023-02-23 DIAGNOSIS — E669 Obesity, unspecified: Secondary | ICD-10-CM | POA: Diagnosis not present

## 2023-02-23 DIAGNOSIS — I428 Other cardiomyopathies: Secondary | ICD-10-CM | POA: Diagnosis not present

## 2023-02-23 DIAGNOSIS — R7989 Other specified abnormal findings of blood chemistry: Secondary | ICD-10-CM | POA: Diagnosis not present

## 2023-02-23 DIAGNOSIS — I447 Left bundle-branch block, unspecified: Secondary | ICD-10-CM | POA: Diagnosis not present

## 2023-02-23 DIAGNOSIS — E785 Hyperlipidemia, unspecified: Secondary | ICD-10-CM | POA: Diagnosis not present

## 2023-02-23 DIAGNOSIS — E782 Mixed hyperlipidemia: Secondary | ICD-10-CM | POA: Diagnosis not present

## 2023-02-23 DIAGNOSIS — I340 Nonrheumatic mitral (valve) insufficiency: Secondary | ICD-10-CM | POA: Diagnosis not present

## 2023-02-23 LAB — BRAIN NATRIURETIC PEPTIDE: B Natriuretic Peptide: 20.6 pg/mL (ref 0.0–100.0)

## 2023-02-23 LAB — TSH: TSH: 2.245 u[IU]/mL (ref 0.350–4.500)

## 2023-02-23 LAB — BASIC METABOLIC PANEL
Anion gap: 11 (ref 5–15)
BUN: 20 mg/dL (ref 6–20)
CO2: 23 mmol/L (ref 22–32)
Calcium: 9.2 mg/dL (ref 8.9–10.3)
Chloride: 103 mmol/L (ref 98–111)
Creatinine, Ser: 0.8 mg/dL (ref 0.44–1.00)
GFR, Estimated: >60 mL/min (ref >60)
Glucose, Bld: 122 mg/dL — ABNORMAL HIGH (ref 70–99)
Potassium: 4.3 mmol/L (ref 3.5–5.1)
Sodium: 137 mmol/L (ref 135–145)

## 2023-02-23 MED ORDER — ROSUVASTATIN CALCIUM 5 MG PO TABS: 5.0000 mg | ORAL_TABLET | Freq: Every day | ORAL | 11 refills | Status: DC

## 2023-02-23 NOTE — Addendum Note (Signed)
Encounter addended by: Modesta Messing, CMA on: 02/23/2023 1:49 PM  Actions taken: Charge Capture section accepted

## 2023-02-23 NOTE — Addendum Note (Signed)
Encounter addended by: Modesta Messing, CMA on: 02/23/2023 1:50 PM  Actions taken: Charge Capture section accepted

## 2023-02-23 NOTE — Patient Instructions (Signed)
START Crestor 5mg  daily  Routine lab work today. Will notify you of abnormal results  Repeat labs in 6-8 weeks  Your provider has recommended that  you wear a Zio Patch for 7 days.  This monitor will record your heart rhythm for our review.  IF you have any symptoms while wearing the monitor please press the button.  If you have any issues with the patch or you notice a red or orange light on it please call the company at [phone].  Once you remove the patch please mail it back to the company as soon as possible so we can get the results.  Your provider requests you have a cardiac MRI in Jan 2025  Follow up with Dr.McLean in 4 months   Do the following things EVERYDAY: Weigh yourself in the morning before breakfast. Write it down and keep it in a log. Take your medicines as prescribed Eat low salt foods--Limit salt (sodium) to 2000 mg per day.  Stay as active as you can everyday Limit all fluids for the day to less than 2 liters

## 2023-02-25 ENCOUNTER — Other Ambulatory Visit (HOSPITAL_COMMUNITY): Payer: Self-pay | Admitting: Cardiology

## 2023-02-26 ENCOUNTER — Other Ambulatory Visit (HOSPITAL_COMMUNITY): Payer: Self-pay

## 2023-02-26 MED ORDER — CARVEDILOL 6.25 MG PO TABS: 6.2500 mg | ORAL_TABLET | Freq: Two times a day (BID) | ORAL | 3 refills | Status: DC

## 2023-03-08 ENCOUNTER — Encounter (HOSPITAL_COMMUNITY): Payer: Self-pay

## 2023-03-08 ENCOUNTER — Telehealth (HOSPITAL_COMMUNITY): Payer: Self-pay

## 2023-03-08 NOTE — Telephone Encounter (Signed)
Forms faxed to [facility] on 11.7.24 at [phone]. my chart message sent to patient

## 2023-03-19 ENCOUNTER — Other Ambulatory Visit (HOSPITAL_COMMUNITY): Payer: Self-pay | Admitting: Cardiology

## 2023-03-19 DIAGNOSIS — R002 Palpitations: Secondary | ICD-10-CM

## 2023-03-19 NOTE — Addendum Note (Signed)
Encounter addended by: Crissie Figures, RN on: 03/19/2023 3:35 PM  Actions taken: Imaging Exam ended

## 2023-03-19 NOTE — Addendum Note (Signed)
Encounter addended by: Crissie Figures, RN on: 03/19/2023 10:54 AM  Actions taken: Imaging Exam begun

## 2023-03-26 ENCOUNTER — Telehealth (HOSPITAL_COMMUNITY): Payer: Self-pay

## 2023-03-26 DIAGNOSIS — I5022 Chronic systolic (congestive) heart failure: Secondary | ICD-10-CM

## 2023-03-26 NOTE — Telephone Encounter (Signed)
Per patient's request her lab appointment has been placed and scheduled.

## 2023-03-27 ENCOUNTER — Inpatient Hospital Stay (HOSPITAL_COMMUNITY): Admission: RE | Admit: 2023-03-27 | Payer: 59 | Source: Ambulatory Visit

## 2023-04-13 ENCOUNTER — Ambulatory Visit (HOSPITAL_COMMUNITY)
Admission: RE | Admit: 2023-04-13 | Discharge: 2023-04-13 | Disposition: A | Discharge status: Home / Self Care | Payer: 59 | Source: Ambulatory Visit | Attending: Cardiology | Admitting: Cardiology

## 2023-04-13 DIAGNOSIS — I5022 Chronic systolic (congestive) heart failure: Secondary | ICD-10-CM | POA: Diagnosis present

## 2023-04-13 LAB — HEPATIC FUNCTION PANEL
ALT: 20 U/L (ref 0–44)
AST: 15 U/L (ref 15–41)
Albumin: 4.1 g/dL (ref 3.5–5.0)
Alkaline Phosphatase: 51 U/L (ref 38–126)
Bilirubin, Direct: 0.1 mg/dL (ref 0.0–0.2)
Indirect Bilirubin: 0.7 mg/dL (ref 0.3–0.9)
Total Bilirubin: 0.8 mg/dL (ref <1.2)
Total Protein: 7.2 g/dL (ref 6.5–8.1)

## 2023-04-13 LAB — LIPID PANEL
Cholesterol: 155 mg/dL (ref 0–200)
HDL: 39 mg/dL — ABNORMAL LOW (ref >40)
LDL Cholesterol: 98 mg/dL (ref 0–99)
Total CHOL/HDL Ratio: 4 {ratio}
Triglycerides: 90 mg/dL (ref <150)
VLDL: 18 mg/dL (ref 0–40)

## 2023-04-30 ENCOUNTER — Encounter (HOSPITAL_COMMUNITY): Payer: Self-pay

## 2023-05-03 ENCOUNTER — Ambulatory Visit (HOSPITAL_COMMUNITY)
Admission: RE | Admit: 2023-05-03 | Discharge: 2023-05-03 | Disposition: A | Discharge status: Home / Self Care | Payer: 59 | Source: Ambulatory Visit | Attending: Family Medicine | Admitting: Family Medicine

## 2023-05-03 ENCOUNTER — Other Ambulatory Visit (HOSPITAL_COMMUNITY): Payer: Self-pay | Admitting: Family Medicine

## 2023-05-03 ENCOUNTER — Other Ambulatory Visit (HOSPITAL_COMMUNITY): Payer: 59

## 2023-05-03 ENCOUNTER — Inpatient Hospital Stay (HOSPITAL_COMMUNITY): Admission: RE | Admit: 2023-05-03 | Payer: 59 | Source: Ambulatory Visit

## 2023-05-03 DIAGNOSIS — I5022 Chronic systolic (congestive) heart failure: Secondary | ICD-10-CM

## 2023-05-03 MED ORDER — GADOBUTROL 1 MMOL/ML IV SOLN: 10.0000 mL | Freq: Once | INTRAVENOUS | Status: DC | PRN

## 2023-05-03 MED ORDER — GADOBUTROL 1 MMOL/ML IV SOLN
10.0000 mL | Freq: Once | INTRAVENOUS | Status: AC | PRN
  Administered 2023-05-03: 10 mL via INTRAVENOUS

## 2023-06-09 ENCOUNTER — Encounter: Payer: Self-pay | Admitting: Nurse Practitioner

## 2023-06-09 ENCOUNTER — Telehealth: Payer: 59 | Admitting: Nurse Practitioner

## 2023-06-09 DIAGNOSIS — J208 Acute bronchitis due to other specified organisms: Secondary | ICD-10-CM

## 2023-06-09 MED ORDER — AZITHROMYCIN 250 MG PO TABS
ORAL_TABLET | ORAL | 0 refills | Status: AC
Start: 2023-06-09 — End: 2023-06-14

## 2023-06-09 MED ORDER — PREDNISONE 20 MG PO TABS
20.0000 mg | ORAL_TABLET | Freq: Every day | ORAL | 0 refills | Status: AC
Start: 2023-06-09 — End: 2023-06-14

## 2023-06-09 NOTE — Progress Notes (Signed)
 Virtual Visit Consent   Amber Stein, you are scheduled for a virtual visit with a [facility] provider today. Just as with appointments in the office, your consent must be obtained to participate. Your consent will be active for this visit and any virtual visit you may have with one of our providers in the next 365 days. If you have a MyChart account, a copy of this consent can be sent to you electronically.  As this is a virtual visit, video technology does not allow for your provider to perform a traditional examination. This may limit your provider's ability to fully assess your condition. If your provider identifies any concerns that need to be evaluated in person or the need to arrange testing (such as labs, EKG, etc.), we will make arrangements to do so. Although advances in technology are sophisticated, we cannot ensure that it will always work on either your end or our end. If the connection with a video visit is poor, the visit may have to be switched to a telephone visit. With either a video or telephone visit, we are not always able to ensure that we have a secure connection.  By engaging in this virtual visit, you consent to the provision of healthcare and authorize for your insurance to be billed (if applicable) for the services provided during this visit. Depending on your insurance coverage, you may receive a charge related to this service.  I need to obtain your verbal consent now. Are you willing to proceed with your visit today? Paizlie Klaus has provided verbal consent on 06/09/2023 for a virtual visit (video or telephone). Haze LELON Servant, NP  Date: 06/09/2023 12:33 PM  Virtual Visit via Video Note   I, Haze LELON Servant, connected with  Amber Stein  ([id number], 05-15-1980) on 06/09/23 at 12:45 PM EST by a video-enabled telemedicine application and verified that I am speaking with the correct person using two identifiers.  Location: Patient: Virtual Visit  Location Patient: Home Provider: Virtual Visit Location Provider: Home Office   I discussed the limitations of evaluation and management by telemedicine and the availability of in person appointments. The patient expressed understanding and agreed to proceed.    History of Present Illness: Amber Stein is a 43 y.o. who identifies as a female who was assigned female at birth, and is being seen today for bronchitis.  Ms. Plack states she has been sick for greater than 7 days. Her symptoms initially started with fever, body aches, chills, cough and now she is experiencing severe chest congestion and worsening productive cough. Taking Mucinex and emergenC, zicam and tylenol  for symptoms but nothing is relieving he cough.  She does not smoke and she does not have a history of asthma.   Problems:  Patient Active Problem List   Diagnosis Date Noted   Chronic systolic CHF (congestive heart failure) (HCC) 12/15/2022   Mitral valve insufficiency 12/15/2022   Class 3 severe obesity due to excess calories with serious comorbidity and body mass index (BMI) of 40.0 to 44.9 in adult [facility]) 12/15/2022   Abnormal EKG 05/09/2022   Elevated liver enzymes 05/05/2022   Abdominal pain 03/31/2022    Allergies: No Known Allergies Medications:  Current Outpatient Medications:    azithromycin  (ZITHROMAX ) 250 MG tablet, Take 2 tablets on day 1, then 1 tablet daily on days 2 through 5, Disp: 6 tablet, Rfl: 0   predniSONE  (DELTASONE ) 20 MG tablet, Take 1 tablet (20 mg total) by mouth daily with  breakfast for 5 days., Disp: 5 tablet, Rfl: 0   acetaminophen  (TYLENOL ) 500 MG tablet, Take 500 mg by mouth every 6 (six) hours as needed for headache, moderate pain or mild pain., Disp: , Rfl:    carvedilol  (COREG ) 6.25 MG tablet, Take 1 tablet (6.25 mg total) by mouth 2 (two) times daily with a meal., Disp: 90 tablet, Rfl: 3   cetirizine (ZYRTEC) 10 MG tablet, Take 10 mg by mouth daily., Disp: , Rfl:    empagliflozin   (JARDIANCE ) 10 MG TABS tablet, Take 1 tablet (10 mg total) by mouth daily before breakfast., Disp: 90 tablet, Rfl: 3   furosemide  (LASIX ) 40 MG tablet, Take 80 mg by mouth daily., Disp: , Rfl:    potassium chloride  SA (KLOR-CON  M) 20 MEQ tablet, Take 1 tablet (20 mEq total) by mouth daily., Disp: 90 tablet, Rfl: 3   rosuvastatin  (CRESTOR ) 5 MG tablet, Take 1 tablet (5 mg total) by mouth daily., Disp: 30 tablet, Rfl: 11   sacubitril -valsartan  (ENTRESTO ) 49-51 MG, Take 1 tablet by mouth 2 (two) times daily., Disp: 180 tablet, Rfl: 3   spironolactone  (ALDACTONE ) 25 MG tablet, Take 1 tablet (25 mg total) by mouth daily., Disp: 90 tablet, Rfl: 3  Observations/Objective: Patient is well-developed, well-nourished in no acute distress.  Resting comfortably at home.  Head is normocephalic, atraumatic.  No labored breathing.  Speech is clear and coherent with logical content.  Patient is alert and oriented at baseline.    Assessment and Plan: 1. Acute viral bronchitis (Primary) - azithromycin  (ZITHROMAX ) 250 MG tablet; Take 2 tablets on day 1, then 1 tablet daily on days 2 through 5  Dispense: 6 tablet; Refill: 0 - predniSONE  (DELTASONE ) 20 MG tablet; Take 1 tablet (20 mg total) by mouth daily with breakfast for 5 days.  Dispense: 5 tablet; Refill: 0  INSTRUCTIONS: use a humidifier for nasal congestion Drink plenty of fluids, rest and wash hands frequently to avoid the spread of infection Alternate tylenol  and Motrin for relief of fever   Follow Up Instructions: I discussed the assessment and treatment plan with the patient. The patient was provided an opportunity to ask questions and all were answered. The patient agreed with the plan and demonstrated an understanding of the instructions.  A copy of instructions were sent to the patient via MyChart unless otherwise noted below.   The patient was advised to call back or seek an in-person evaluation if the symptoms worsen or if the condition fails  to improve as anticipated.    Deven Furia W Deloise Marchant, NP

## 2023-06-09 NOTE — Patient Instructions (Signed)
 Evalene Nat Stager, thank you for joining Haze LELON Servant, NP for today's virtual visit.  While this provider is not your primary care provider (PCP), if your PCP is located in our provider database this encounter information will be shared with them immediately following your visit.   A [facility] MyChart account gives you access to today's visit and all your visits, tests, and labs performed at [facility]  click here if you don't have a [facility] MyChart account or go to [email or web address]  Consent: (Patient) Alexsandria Kivett provided verbal consent for this virtual visit at the beginning of the encounter.  Current Medications:  Current Outpatient Medications:    azithromycin  (ZITHROMAX ) 250 MG tablet, Take 2 tablets on day 1, then 1 tablet daily on days 2 through 5, Disp: 6 tablet, Rfl: 0   predniSONE  (DELTASONE ) 20 MG tablet, Take 1 tablet (20 mg total) by mouth daily with breakfast for 5 days., Disp: 5 tablet, Rfl: 0   acetaminophen  (TYLENOL ) 500 MG tablet, Take 500 mg by mouth every 6 (six) hours as needed for headache, moderate pain or mild pain., Disp: , Rfl:    carvedilol  (COREG ) 6.25 MG tablet, Take 1 tablet (6.25 mg total) by mouth 2 (two) times daily with a meal., Disp: 90 tablet, Rfl: 3   cetirizine (ZYRTEC) 10 MG tablet, Take 10 mg by mouth daily., Disp: , Rfl:    empagliflozin  (JARDIANCE ) 10 MG TABS tablet, Take 1 tablet (10 mg total) by mouth daily before breakfast., Disp: 90 tablet, Rfl: 3   furosemide  (LASIX ) 40 MG tablet, Take 80 mg by mouth daily., Disp: , Rfl:    potassium chloride  SA (KLOR-CON  M) 20 MEQ tablet, Take 1 tablet (20 mEq total) by mouth daily., Disp: 90 tablet, Rfl: 3   rosuvastatin  (CRESTOR ) 5 MG tablet, Take 1 tablet (5 mg total) by mouth daily., Disp: 30 tablet, Rfl: 11   sacubitril -valsartan  (ENTRESTO ) 49-51 MG, Take 1 tablet by mouth 2 (two) times daily., Disp: 180 tablet, Rfl: 3   spironolactone  (ALDACTONE ) 25 MG tablet,  Take 1 tablet (25 mg total) by mouth daily., Disp: 90 tablet, Rfl: 3   Medications ordered in this encounter:  Meds ordered this encounter  Medications   azithromycin  (ZITHROMAX ) 250 MG tablet    Sig: Take 2 tablets on day 1, then 1 tablet daily on days 2 through 5    Dispense:  6 tablet    Refill:  0    Supervising Provider:   LAMPTEY, PHILIP O [[id number]]   predniSONE  (DELTASONE ) 20 MG tablet    Sig: Take 1 tablet (20 mg total) by mouth daily with breakfast for 5 days.    Dispense:  5 tablet    Refill:  0    Supervising Provider:   BLAISE ALEENE KIDD [[id number]]     *If you need refills on other medications prior to your next appointment, please contact your pharmacy*  Follow-Up: Call back or seek an in-person evaluation if the symptoms worsen or if the condition fails to improve as anticipated.   Virtual Care [phone]  Other Instructions INSTRUCTIONS: use a humidifier for nasal congestion Drink plenty of fluids, rest and wash hands frequently to avoid the spread of infection Alternate tylenol  and Motrin for relief of fever    If you have been instructed to have an in-person evaluation today at a local Urgent Care facility, please use the link below. It will take you to a list of all of our available  [facility], including address, phone number and hours of operation. Please do not delay care.  [facility]  If you or a family member do not have a primary care provider, use the link below to schedule a visit and establish care. When you choose a [facility] primary care physician or advanced practice provider, you gain a long-term partner in health. Find a Primary Care Provider  Learn more about [facility] in-office and virtual care options: [facility] - Get Care Now

## 2023-06-27 ENCOUNTER — Ambulatory Visit (HOSPITAL_COMMUNITY)
Admission: RE | Admit: 2023-06-27 | Discharge: 2023-06-27 | Disposition: A | Discharge status: Home / Self Care | Payer: 59 | Source: Ambulatory Visit | Attending: Cardiology | Admitting: Cardiology

## 2023-06-27 ENCOUNTER — Encounter (HOSPITAL_COMMUNITY): Payer: Self-pay | Admitting: Cardiology

## 2023-06-27 VITALS — BP 104/68 | HR 76 | Wt 267.2 lb

## 2023-06-27 DIAGNOSIS — I5022 Chronic systolic (congestive) heart failure: Secondary | ICD-10-CM | POA: Insufficient documentation

## 2023-06-27 DIAGNOSIS — Z6841 Body Mass Index (BMI) 40.0 and over, adult: Secondary | ICD-10-CM | POA: Insufficient documentation

## 2023-06-27 DIAGNOSIS — I447 Left bundle-branch block, unspecified: Secondary | ICD-10-CM | POA: Insufficient documentation

## 2023-06-27 DIAGNOSIS — Z79899 Other long term (current) drug therapy: Secondary | ICD-10-CM | POA: Diagnosis not present

## 2023-06-27 DIAGNOSIS — K760 Fatty (change of) liver, not elsewhere classified: Secondary | ICD-10-CM | POA: Insufficient documentation

## 2023-06-27 DIAGNOSIS — I340 Nonrheumatic mitral (valve) insufficiency: Secondary | ICD-10-CM | POA: Diagnosis not present

## 2023-06-27 DIAGNOSIS — E669 Obesity, unspecified: Secondary | ICD-10-CM | POA: Diagnosis not present

## 2023-06-27 DIAGNOSIS — E785 Hyperlipidemia, unspecified: Secondary | ICD-10-CM | POA: Diagnosis not present

## 2023-06-27 DIAGNOSIS — I428 Other cardiomyopathies: Secondary | ICD-10-CM | POA: Diagnosis not present

## 2023-06-27 LAB — BASIC METABOLIC PANEL
Anion gap: 9 (ref 5–15)
BUN: 17 mg/dL (ref 6–20)
CO2: 25 mmol/L (ref 22–32)
Calcium: 9.1 mg/dL (ref 8.9–10.3)
Chloride: 103 mmol/L (ref 98–111)
Creatinine, Ser: 0.81 mg/dL (ref 0.44–1.00)
GFR, Estimated: >60 mL/min (ref >60)
Glucose, Bld: 131 mg/dL — ABNORMAL HIGH (ref 70–99)
Potassium: 4.2 mmol/L (ref 3.5–5.1)
Sodium: 137 mmol/L (ref 135–145)

## 2023-06-27 LAB — BRAIN NATRIURETIC PEPTIDE: B Natriuretic Peptide: 19.9 pg/mL (ref 0.0–100.0)

## 2023-06-27 MED ORDER — FUROSEMIDE 40 MG PO TABS: 40.0000 mg | ORAL_TABLET | Freq: Every day | ORAL | Status: DC

## 2023-06-27 MED ORDER — SACUBITRIL-VALSARTAN 97-103 MG PO TABS: 1.0000 | ORAL_TABLET | Freq: Two times a day (BID) | ORAL | 11 refills | Status: DC

## 2023-06-27 NOTE — Patient Instructions (Signed)
 INCREASE Entresto to 97/103 mg Twice daily  DECREASE Lasix to 40 mg daily.  Labs done today, your results will be available in MyChart, we will contact you for abnormal readings.  Repeat blood work in 10 days.  You have been referred to the HEART CARE PHARMACY. They will call you to arrange your appointment.  Your physician recommends that you schedule a follow-up appointment in: 4 months (June) ** PLEASE CALL THE OFFICE IN [place] TO ARRANGE YOUR FOLLOW UP APPOINTMENT.**  If you have any questions or concerns before your next appointment please send [place] a message through [place] or call our office at [phone].    TO LEAVE A MESSAGE FOR THE NURSE SELECT OPTION 2, PLEASE LEAVE A MESSAGE INCLUDING: YOUR NAME DATE OF BIRTH CALL BACK NUMBER REASON FOR CALL**this is important as we prioritize the call backs  YOU WILL RECEIVE A CALL BACK THE SAME DAY AS LONG AS YOU CALL BEFORE 4:00 PM  At the [facility], you and your health needs are our priority. As part of our continuing mission to provide you with exceptional heart care, we have created designated Provider Care Teams. These Care Teams include your primary Cardiologist (physician) and Advanced Practice Providers (APPs- Physician Assistants and Nurse Practitioners) who all work together to provide you with the care you need, when you need it.   You may see any of the following providers on your designated Care Team at your next follow up: Dr Arvilla Meres Dr Marca Ancona Dr. Dorthula Nettles Dr. Clearnce Hasten Amy Filbert Schilder, NP Robbie Lis, [place] [facility] [place], [place] Brynda Peon, NP [place] Lee, NP Clarisa Kindred, NP Karle Plumber, PharmD Enos Fling, PharmD   Please be sure to bring in all your medications bottles to every appointment.    Thank you for choosing [facility]

## 2023-06-27 NOTE — Progress Notes (Signed)
 PCP: Amber Dory, DO Cardiology: Dr. Tenny Craw HF Cardiology: Dr. Shirlee Latch  Chief complaint: CHF  43 y.o. with history of cardiomyopathy was referred by Dr. Tenny Craw for evaluation of CHF.  In 12/23, patient developed abdominal pain, nausea, vomiting, and diarrhea. RUQ US showed fatty liver.  She does not drink ETOH.  She was thought to have NAFLD.  She also noted the development of significant exertional dyspnea with most activities, even just walking around the house.  Echo was done in 1/24 showing EF 20-25%, severe functional MR, mild RV dysfunction. ECG showed LBBB.  She was started on Lasix for diuresis.  RHC/LHC in 1/24 showed no CAD, elevated filling pressures and low but not markedly low cardiac output.     Cardiac MRI in 5/24 showed EF 35%, septal-lateral dyssynchrony, RV EF 31%, no LGE, moderate MR with regurgitant fraction 26%. Echo in 7/24 showed EF 35-40%, septal-lateral dyssynchrony, normal RV, mild-moderate MR.   Cardiac MRI in 1/25 showed LV EF 52%, septal-lateral dyssynchrony, RV EF 42%, no LGE, ECV 26%.   Today she returns for HF follow up.  She has some generalized fatigue.  Taking classes at [employer] and able to walk across the campus, only gets mildly winded with this.  Gets in at least 10,000 steps/day. Mild lightheadedness with rapid standing.  No orthopnea/PND.  No chest pain. Weight up 5 lbs. SBP 110s at home.   ECG (personally reviewed): NSR, LBBB 142 msec  Labs (2/24): Pro-BNP 1519, K 3.7, creatinine 0.93 Labs (3/24): K 4.6, creatinine 0.83, AST 42, ALT 50 Labs (4/24): BNP 43, K 4.3, creatinine 0.83 Labs (5/24): LDL 151 Labs (7/24): K 4.6, creatinine 0.81, normal LFTs Labs (10/24): TSH normal, BNP 21, K 4.3, creatinine 0.8 Labs (12/24): LFTs normal, LDL 98  PMH:  1. LBBB 2. Chronic systolic CHF: Echo (1/24) with EF 20-25%, severe functional MR, mild RV dysfunction.  - LHC/RHC (1/24): No significant CAD; mean RA 15, PA 58/21 mean 37, mean PCWP 26, CI 2.18, PVR 2.3  WU, PAPi 2.5.  - Cardiac MRI in 5/24: EF 35%, septal-lateral dyssynchrony, RV EF 31%, no LGE, moderate MR with regurgitant fraction 26%.  - Echo (7/24): EF 35-40%, septal-lateral dyssynchrony, normal RV, mild-moderate MR.  - Cardiac MRI (1/25): LV EF 52%, septal-lateral dyssynchrony, RV EF 42%, no LGE, ECV 26% 3. Mitral regurgitation: Suspect functional, severe on 1/24 echo.  4. NAFLD: Chronic elevated LFTs.  - Abdominal US (1/24): Fatty liver.  5. Zio monitor 11/24 with no significant arrhythmias.   SH: Married, lives in [place], works as Teacher, early years/pre, no drugs, no ETOH, no smoking.   FH: No family history of CHF or sudden death.   ROS: All systems reviewed and negative except as per HPI.   Current Outpatient Medications  Medication Sig Dispense Refill   acetaminophen (TYLENOL) 500 MG tablet Take 500 mg by mouth every 6 (six) hours as needed for headache, moderate pain or mild pain.     carvedilol (COREG) 6.25 MG tablet Take 1 tablet (6.25 mg total) by mouth 2 (two) times daily with a meal. 90 tablet 3   cetirizine (ZYRTEC) 10 MG tablet Take 10 mg by mouth daily.     empagliflozin (JARDIANCE) 10 MG TABS tablet Take 1 tablet (10 mg total) by mouth daily before breakfast. 90 tablet 3   potassium chloride SA (KLOR-CON M) 20 MEQ tablet Take 1 tablet (20 mEq total) by mouth daily. 90 tablet 3   rosuvastatin (CRESTOR) 5 MG tablet Take 1 tablet (  5 mg total) by mouth daily. 30 tablet 11   sacubitril-valsartan (ENTRESTO) 97-103 MG Take 1 tablet by mouth 2 (two) times daily. 60 tablet 11   spironolactone (ALDACTONE) 25 MG tablet Take 1 tablet (25 mg total) by mouth daily. 90 tablet 3   furosemide (LASIX) 40 MG tablet Take 1 tablet (40 mg total) by mouth daily.     No current facility-administered medications for this encounter.   Wt Readings from Last 3 Encounters:  06/27/23 121.2 kg (267 lb 3.2 oz)  02/23/23 118.8 kg (262 lb)  01/31/23 119.3 kg (263 lb)   BP 104/68   Pulse 76    Wt 121.2 kg (267 lb 3.2 oz)   SpO2 98%   BMI 47.33 kg/m  General: NAD, obese Neck: No JVD, no thyromegaly or thyroid nodule.  Lungs: Clear to auscultation bilaterally with normal respiratory effort. CV: Nondisplaced PMI.  Heart regular S1/S2, no S3/S4, no murmur.  No peripheral edema.  No carotid bruit.  Normal pedal pulses.  Abdomen: Soft, nontender, no hepatosplenomegaly, no distention.  Skin: Intact without lesions or rashes.  Neurologic: Alert and oriented x 3.  Psych: Normal affect. Extremities: No clubbing or cyanosis.  HEENT: Normal.   Assessment/Plan: 1. Chronic systolic CHF: Nonischemic cardiomyopathy.  Echo in 1/24 showed EF 20-25%, severe functional MR, mild RV dysfunction. ECG showed LBBB.  RHC/LHC in 1/24 showed no CAD, elevated filling pressures and low but not markedly low cardiac output. No ETOH or drug history.  TSH normal.  No family history of cardiomyopathy or sudden death.  ?Viral myocarditis as etiology or LBBB cardiomyopathy.  Cardiac MRI in 5/24 showed EF 35%, septal-lateral dyssynchrony, RV EF 31%, no LGE, moderate MR with regurgitant fraction 26%. Echo 7/24 showed EF 35-40%, septal-lateral dyssynchrony, normal RV, mild-moderate MR. Cardiac MRI in 1/25 with improvement, LV EF 52%, septal-lateral dyssynchrony, RV EF 42%, no LGE, ECV 26%.  Stable NYHA class II symptoms, not volume overloaded on exam.  - Continue Coreg 6.25 mg bid.  - Increase Entresto to 97/103 bid. BMET and BNP today, BMET in 10 days.  - Decrease Lasix to 40 mg daily.  - Continue spironolactone 25 mg daily. - Continue Jardiance 10 mg daily.  - EF is out of range for CRT-D.  - Encouraged contraception, discussed feto-toxicity of GDMT.  2. Mitral regurgitation: Severe on 1/24 echo though she does not have a loud murmur.  Suspect functional.  Echo 7/24 showed mild-moderate MR.  3. Elevated LFTs: Fatty liver on [place], suspect NAFLD.  Congestive hepatopathy likely plays a role.  Most recent LFTs normalized.  She does not drink a significant amount of ETOH or regularly use acetaminophen 4. Hyperlipidemia: Continue Crestor, 12/24 LDL improved.   5. Obesity: Body mass index is 47.33 kg/m. - Insurance did not cover GLP-1 agonists last year.  I will refer her to pharmacy clinic to check again to see if she can get Ozempic or Mounjaro.    Followup in 4 months with APP  I spent 31 minutes reviewing records, interviewing/examining patient, and managing orders.   Marca Ancona  06/27/2023

## 2023-07-04 ENCOUNTER — Ambulatory Visit

## 2023-07-04 NOTE — Progress Notes (Deleted)
 Office Visit    Patient Name: Amber Stein Date of Encounter: 07/04/2023  Primary Care Provider:  Sharlene Dory, DO Primary Cardiologist:  None  Chief Complaint    Weight management  Significant Past Medical History   CHF 1/24 echo 20-25%, improved to 35-40% in July                No Known Allergies  History of Present Illness    Amber Stein is a 43 y.o. female patient of Dr ***, in the office today to discuss options for weight management.    *** If diabetic and on insulin/sulfonylurea, can consider reducing dose to reduce risk of hypoglycemia  *** Follow-up visit  Assess % weight loss Assess adverse effects Missed doses  Current weight management medications:   Previously tried meds:   Current meds that may affect weight:   Baseline weight/BMI:   Insurance payor:  [employer] commercial  Diet:   Exercise:   Family History:   Confirmed patient not ***pregnant and no personal or family history of medullary thyroid carcinoma (MTC) or Multiple Endocrine Neoplasia syndrome type 2 (MEN 2).   Social History:   Tobacco:  Alcohol:  Caffeine:   Adherence Assessment  Do you ever forget to take your medication? [] Yes [] No  Do you ever skip doses due to side effects? [] Yes [] No  Do you have trouble affording your medicines? [] Yes [] No  Are you ever unable to pick up your medication due to transportation difficulties? [] Yes [] No  Do you ever stop taking your medications because you don't believe they are helping? [] Yes [] No  Do you check your weight daily? [] Yes [] No   Adherence strategy: ***  Barriers to obtaining medications: ***   Accessory Clinical Findings    Lab Results  Component Value Date   CREATININE 0.81 06/27/2023   BUN 17 06/27/2023   NA 137 06/27/2023   K 4.2 06/27/2023   CL 103 06/27/2023   CO2 25 06/27/2023   Lab Results  Component Value Date   ALT 20 04/13/2023   AST 15 04/13/2023   ALKPHOS 51  04/13/2023   BILITOT 0.8 04/13/2023   Lab Results  Component Value Date   HGBA1C 5.5 11/23/2022      Home Medications/Allergies    Current Outpatient Medications  Medication Sig Dispense Refill   acetaminophen (TYLENOL) 500 MG tablet Take 500 mg by mouth every 6 (six) hours as needed for headache, moderate pain or mild pain.     carvedilol (COREG) 6.25 MG tablet Take 1 tablet (6.25 mg total) by mouth 2 (two) times daily with a meal. 90 tablet 3   cetirizine (ZYRTEC) 10 MG tablet Take 10 mg by mouth daily.     empagliflozin (JARDIANCE) 10 MG TABS tablet Take 1 tablet (10 mg total) by mouth daily before breakfast. 90 tablet 3   furosemide (LASIX) 40 MG tablet Take 1 tablet (40 mg total) by mouth daily.     potassium chloride SA (KLOR-CON M) 20 MEQ tablet Take 1 tablet (20 mEq total) by mouth daily. 90 tablet 3   rosuvastatin (CRESTOR) 5 MG tablet Take 1 tablet (5 mg total) by mouth daily. 30 tablet 11   sacubitril-valsartan (ENTRESTO) 97-103 MG Take 1 tablet by mouth 2 (two) times daily. 60 tablet 11   spironolactone (ALDACTONE) 25 MG tablet Take 1 tablet (25 mg total) by mouth daily. 90 tablet 3   No current facility-administered medications for this visit.  No Known Allergies  Assessment & Plan    No problem-specific Assessment & Plan notes found for this encounter.   Phillips Hay PharmD CPP [facility]  [address] [place], [place] [address] [phone]

## 2023-07-11 ENCOUNTER — Ambulatory Visit (HOSPITAL_COMMUNITY)
Admission: RE | Admit: 2023-07-11 | Discharge: 2023-07-11 | Disposition: A | Discharge status: Home / Self Care | Payer: 59 | Source: Ambulatory Visit | Attending: Cardiology | Admitting: Cardiology

## 2023-07-11 DIAGNOSIS — I5022 Chronic systolic (congestive) heart failure: Secondary | ICD-10-CM | POA: Insufficient documentation

## 2023-07-11 LAB — BASIC METABOLIC PANEL
Anion gap: 6 (ref 5–15)
BUN: 14 mg/dL (ref 6–20)
CO2: 25 mmol/L (ref 22–32)
Calcium: 8.9 mg/dL (ref 8.9–10.3)
Chloride: 104 mmol/L (ref 98–111)
Creatinine, Ser: 0.97 mg/dL (ref 0.44–1.00)
GFR, Estimated: >60 mL/min (ref >60)
Glucose, Bld: 135 mg/dL — ABNORMAL HIGH (ref 70–99)
Potassium: 4.5 mmol/L (ref 3.5–5.1)
Sodium: 135 mmol/L (ref 135–145)

## 2023-07-25 ENCOUNTER — Other Ambulatory Visit (HOSPITAL_COMMUNITY): Payer: Self-pay | Admitting: Cardiology

## 2023-08-10 ENCOUNTER — Other Ambulatory Visit: Payer: Self-pay

## 2023-08-10 ENCOUNTER — Encounter: Payer: Self-pay | Admitting: Family Medicine

## 2023-08-10 ENCOUNTER — Other Ambulatory Visit

## 2023-08-10 ENCOUNTER — Ambulatory Visit: Payer: 59 | Admitting: Family Medicine

## 2023-08-10 VITALS — BP 126/86 | HR 77 | Temp 98.2°F | Resp 18 | Ht 63.0 in | Wt 271.0 lb

## 2023-08-10 DIAGNOSIS — Z0000 Encounter for general adult medical examination without abnormal findings: Secondary | ICD-10-CM

## 2023-08-10 DIAGNOSIS — Z23 Encounter for immunization: Secondary | ICD-10-CM | POA: Diagnosis not present

## 2023-08-10 DIAGNOSIS — R7309 Other abnormal glucose: Secondary | ICD-10-CM

## 2023-08-10 LAB — CBC
HCT: 41.3 % (ref 36.0–46.0)
Hemoglobin: 14.1 g/dL (ref 12.0–15.0)
MCHC: 34.1 g/dL (ref 30.0–36.0)
MCV: 87.8 fl (ref 78.0–100.0)
Platelets: 322 10*3/uL (ref 150.0–400.0)
RBC: 4.71 Mil/uL (ref 3.87–5.11)
RDW: 13.7 % (ref 11.5–15.5)
WBC: 8.4 10*3/uL (ref 4.0–10.5)

## 2023-08-10 LAB — COMPREHENSIVE METABOLIC PANEL WITH GFR
ALT: 22 U/L (ref 0–35)
AST: 17 U/L (ref 0–37)
Albumin: 4.9 g/dL (ref 3.5–5.2)
Alkaline Phosphatase: 60 U/L (ref 39–117)
BUN: 17 mg/dL (ref 6–23)
CO2: 28 meq/L (ref 19–32)
Calcium: 9.1 mg/dL (ref 8.4–10.5)
Chloride: 101 meq/L (ref 96–112)
Creatinine, Ser: 0.81 mg/dL (ref 0.40–1.20)
GFR: 90.41 mL/min (ref >60.00)
Glucose, Bld: 136 mg/dL — ABNORMAL HIGH (ref 70–99)
Potassium: 4.2 meq/L (ref 3.5–5.1)
Sodium: 136 meq/L (ref 135–145)
Total Bilirubin: 0.6 mg/dL (ref 0.2–1.2)
Total Protein: 7.2 g/dL (ref 6.0–8.3)

## 2023-08-10 LAB — LIPID PANEL
Cholesterol: 159 mg/dL (ref 0–200)
HDL: 38.8 mg/dL — ABNORMAL LOW (ref >39.00)
LDL Cholesterol: 88 mg/dL (ref 0–99)
NonHDL: 119.71
Total CHOL/HDL Ratio: 4
Triglycerides: 161 mg/dL — ABNORMAL HIGH (ref 0.0–149.0)
VLDL: 32.2 mg/dL (ref 0.0–40.0)

## 2023-08-10 LAB — HEMOGLOBIN A1C: Hgb A1c MFr Bld: 6.3 % (ref 4.6–6.5)

## 2023-08-10 NOTE — Patient Instructions (Addendum)
 Give [place] 2-3 business days to get the results of your labs back.   Keep the diet clean and stay active.  Please consider adding some weight resistance exercise to your routine. Consider yoga as well.   Please get me a copy of your advanced directive form at your convenience.   OK to use Debrox (peroxide) in the ear to loosen up wax. Also recommend using a bulb syringe (for removing boogers from baby's noses) to flush through warm water and vinegar (3-4:1 ratio). An alternative, though more expensive, is an elephant ear washer wax removal kit. Do not use Q-tips as this can impact wax further.  Try some yoga for the hips and knees.   Let us know if you need anything.

## 2023-08-10 NOTE — Progress Notes (Signed)
 Chief Complaint  Patient presents with   Annual Exam     Well Woman Amber Stein is here for a complete physical.   Her last physical was >1 year ago.  Current diet: in general, diet is fair. Current exercise: some walking, active at work. Weight is increased a little and she denies fatigue out of ordinary. Patient's last menstrual period was 07/22/2023.Marland Kitchen  Seatbelt? Yes Advanced directive? Yes  Health Maintenance Pap/HPV- Yes Mammogram- Yes Tetanus- Yes Hep C screening- Yes HIV screening- Yes  Past Medical History:  Diagnosis Date   NAFLD (nonalcoholic fatty liver disease)      Past Surgical History:  Procedure Laterality Date   PILONIDAL CYST EXCISION  2000   RIGHT/LEFT HEART CATH AND CORONARY ANGIOGRAPHY N/A 05/25/2022   Procedure: RIGHT/LEFT HEART CATH AND CORONARY ANGIOGRAPHY;  Surgeon: Laurey Morale, MD;  Location: [facility] INVASIVE CV LAB;  Service: Cardiovascular;  Laterality: N/A;    Medications  Current Outpatient Medications on File Prior to Visit  Medication Sig Dispense Refill   acetaminophen (TYLENOL) 500 MG tablet Take 500 mg by mouth every 6 (six) hours as needed for headache, moderate pain or mild pain.     carvedilol (COREG) 6.25 MG tablet Take 1 tablet (6.25 mg total) by mouth 2 (two) times daily with a meal. 90 tablet 3   cetirizine (ZYRTEC) 10 MG tablet Take 10 mg by mouth daily.     empagliflozin (JARDIANCE) 10 MG TABS tablet Take 1 tablet (10 mg total) by mouth daily before breakfast. 90 tablet 3   furosemide (LASIX) 40 MG tablet Take 1 tablet (40 mg total) by mouth daily.     potassium chloride SA (KLOR-CON M) 20 MEQ tablet Take 1 tablet (20 mEq total) by mouth daily. 90 tablet 3   rosuvastatin (CRESTOR) 5 MG tablet Take 1 tablet (5 mg total) by mouth daily. 30 tablet 11   sacubitril-valsartan (ENTRESTO) 97-103 MG Take 1 tablet by mouth 2 (two) times daily. 60 tablet 11   spironolactone (ALDACTONE) 25 MG tablet TAKE 1 TABLET(25 MG) BY MOUTH  DAILY 90 tablet 3    Allergies No Known Allergies  Review of Systems: Constitutional:  no unexpected weight changes Eye:  no recent significant change in vision Ear/Nose/Mouth/Throat:  Ears:  no recent change in hearing Nose/Mouth/Throat:  no complaints of nasal congestion, no sore throat Cardiovascular: no chest pain Respiratory:  no shortness of breath Gastrointestinal:  no abdominal pain, no change in bowel habits GU:  Female: negative for dysuria or pelvic pain Musculoskeletal/Extremities:  no pain of the joints Integumentary (Skin/Breast):  no abnormal skin lesions reported Neurologic:  no headaches Endocrine:  denies fatigue Hematologic/Lymphatic:  No areas of easy bleeding  Exam BP 126/86   Pulse 77   Temp 98.2 F (36.8 C)   Resp 18   Ht 5\' 3"  (1.6 m)   Wt 271 lb (122.9 kg)   LMP 07/22/2023   SpO2 98%   BMI 48.01 kg/m  General:  well developed, well nourished, in no apparent distress Skin:  no significant moles, warts, or growths Head:  no masses, lesions, or tenderness Eyes:  pupils equal and round, sclera anicteric without injection Ears:  canals without lesions, TMs shiny without retraction, no obvious effusion, no erythema Nose:  nares patent, mucosa normal, and no drainage Throat/Pharynx:  lips and gingiva without lesion; tongue and uvula midline; non-inflamed pharynx; no exudates or postnasal drainage Neck: neck supple without adenopathy, thyromegaly, or masses Lungs:  clear to auscultation,  breath sounds equal bilaterally, no respiratory distress Cardio:  regular rate and rhythm, no LE edema Abdomen:  abdomen soft, nontender; bowel sounds normal; no masses or organomegaly Genital: Defer to GYN Musculoskeletal:  symmetrical muscle groups noted without atrophy or deformity Extremities:  no clubbing, cyanosis, or edema, no deformities, no skin discoloration Neuro:  gait normal; deep tendon reflexes normal and symmetric Psych: well oriented with normal range  of affect and appropriate judgment/insight  Assessment and Plan  Well adult exam - Plan: CBC, Comprehensive metabolic panel with GFR, Lipid panel  Need for pneumococcal 20-valent conjugate vaccination - Plan: Pneumococcal conjugate vaccine 20-valent (Prevnar 20)   Well 43 y.o. female. Counseled on diet and exercise. Advanced directive form requested today.  PCV20 today.  Other orders as above. Follow up in 1 yr. The patient voiced understanding and agreement to the plan.  Jilda Roche Giltner, DO 08/10/23 8:29 AM

## 2023-08-13 ENCOUNTER — Telehealth (HOSPITAL_COMMUNITY): Payer: Self-pay

## 2023-08-13 ENCOUNTER — Other Ambulatory Visit: Payer: Self-pay | Admitting: Family Medicine

## 2023-08-13 MED ORDER — SEMAGLUTIDE-WEIGHT MANAGEMENT 1 MG/0.5ML ~~LOC~~ SOAJ: 1.0000 mg | SUBCUTANEOUS | 0 refills | Status: DC

## 2023-08-13 MED ORDER — SEMAGLUTIDE-WEIGHT MANAGEMENT 1.7 MG/0.75ML ~~LOC~~ SOAJ
1.7000 mg | SUBCUTANEOUS | 0 refills | Status: DC
Start: 2023-11-08 — End: 2023-08-22

## 2023-08-13 MED ORDER — SEMAGLUTIDE-WEIGHT MANAGEMENT 2.4 MG/0.75ML ~~LOC~~ SOAJ: 2.4000 mg | SUBCUTANEOUS | 0 refills | Status: DC

## 2023-08-13 MED ORDER — SEMAGLUTIDE-WEIGHT MANAGEMENT 0.5 MG/0.5ML ~~LOC~~ SOAJ: 0.5000 mg | SUBCUTANEOUS | 0 refills | Status: DC

## 2023-08-13 MED ORDER — SEMAGLUTIDE-WEIGHT MANAGEMENT 0.25 MG/0.5ML ~~LOC~~ SOAJ: 0.2500 mg | SUBCUTANEOUS | 0 refills | Status: DC

## 2023-08-13 NOTE — Telephone Encounter (Signed)
 Disability forms faxed to [employer] on 08/13/23  at [phone]

## 2023-08-22 ENCOUNTER — Encounter: Payer: Self-pay | Admitting: Pharmacist

## 2023-08-22 ENCOUNTER — Telehealth: Payer: Self-pay

## 2023-08-22 ENCOUNTER — Other Ambulatory Visit (HOSPITAL_COMMUNITY): Payer: Self-pay

## 2023-08-22 ENCOUNTER — Telehealth: Payer: Self-pay | Admitting: Pharmacist

## 2023-08-22 ENCOUNTER — Ambulatory Visit: Attending: Internal Medicine | Admitting: Pharmacist

## 2023-08-22 VITALS — Ht 63.0 in | Wt 269.8 lb

## 2023-08-22 DIAGNOSIS — R7303 Prediabetes: Secondary | ICD-10-CM

## 2023-08-22 MED ORDER — MOUNJARO 2.5 MG/0.5ML ~~LOC~~ SOAJ: 2.5000 mg | SUBCUTANEOUS | 0 refills | Status: DC

## 2023-08-22 NOTE — Progress Notes (Signed)
 Patient ID: Amber Stein                 DOB: December 22, 1980                    MRN: [id number]     HPI: Amber Stein is a 43 y.o. female patient referred to pharmacy clinic by Dr.Mclean to initiate GLP1-RA therapy. PMH is significant for systolic CHF, mitral regurgitation, hyperlipidemia,  and obesity. Most recent BMI 47.33 kg/m .  Baseline weight and BMI: 267 lbs 47.33 kg/m  Current weight and BMI: 271 lbs 48.02 kg/m  Current meds that affect weight: Jardiance  10 mg daily, furosemide  40 mg daily  Patient has been trying various diets without any meaning full result on weight. Walks a lot at work but does not have set exercise schedule  Diet:  Breakfast: skips most day and sometime yogurt with granola  Lunch: salads gilled chicken  Dinner: meat - grilled or baked- mainly chicken , corn green beans and salads, rice as sides  Snack: none  Drink: water when not eating out, sweet tea - once every 2 week   Exercise: try to hit 10K steps at work   Family History:   Relation Problem Comments  Mother Metallurgist) Stroke     Father (Deceased)   Sister Metallurgist)     Social History:  Alcohol: none  Smoking: never  Labs: Lab Results  Component Value Date   HGBA1C 6.3 08/10/2023    Wt Readings from Last 1 Encounters:  08/10/23 271 lb (122.9 kg)    BP Readings from Last 1 Encounters:  08/10/23 126/86   Pulse Readings from Last 1 Encounters:  08/10/23 77       Component Value Date/Time   CHOL 159 08/10/2023 0821   CHOL 200 (H) 02/28/2019 1022   TRIG 161.0 (H) 08/10/2023 0821   HDL 38.80 (L) 08/10/2023 0821   HDL 37 (L) 02/28/2019 1022   CHOLHDL 4 08/10/2023 0821   VLDL 32.2 08/10/2023 0821   LDLCALC 88 08/10/2023 0821   LDLCALC 135 (H) 02/28/2019 1022    Past Medical History:  Diagnosis Date   NAFLD (nonalcoholic fatty liver disease)     Current Outpatient Medications on File Prior to Visit  Medication Sig Dispense Refill   carvedilol  (COREG ) 6.25 MG  tablet Take 1 tablet (6.25 mg total) by mouth 2 (two) times daily with a meal. 90 tablet 3   cetirizine (ZYRTEC) 10 MG tablet Take 10 mg by mouth daily.     empagliflozin  (JARDIANCE ) 10 MG TABS tablet Take 1 tablet (10 mg total) by mouth daily before breakfast. 90 tablet 3   furosemide  (LASIX ) 40 MG tablet Take 1 tablet (40 mg total) by mouth daily.     potassium chloride  SA (KLOR-CON  M) 20 MEQ tablet Take 1 tablet (20 mEq total) by mouth daily. 90 tablet 3   rosuvastatin  (CRESTOR ) 5 MG tablet Take 1 tablet (5 mg total) by mouth daily. 30 tablet 11   sacubitril -valsartan  (ENTRESTO ) 97-103 MG Take 1 tablet by mouth 2 (two) times daily. 60 tablet 11   Semaglutide -Weight Management 0.25 MG/0.5ML SOAJ Inject 0.25 mg into the skin once a week for 28 days. 2 mL 0   [START ON 09/11/2023] Semaglutide -Weight Management 0.5 MG/0.5ML SOAJ Inject 0.5 mg into the skin once a week for 28 days. 2 mL 0   [START ON 10/10/2023] Semaglutide -Weight Management 1 MG/0.5ML SOAJ Inject 1 mg into the skin once a week for  28 days. 2 mL 0   [START ON 11/08/2023] Semaglutide -Weight Management 1.7 MG/0.75ML SOAJ Inject 1.7 mg into the skin once a week for 28 days. 3 mL 0   [START ON 12/07/2023] Semaglutide -Weight Management 2.4 MG/0.75ML SOAJ Inject 2.4 mg into the skin once a week for 28 days. 3 mL 0   spironolactone  (ALDACTONE ) 25 MG tablet TAKE 1 TABLET(25 MG) BY MOUTH DAILY 90 tablet 3   No current facility-administered medications on file prior to visit.    No Known Allergies   Assessment/Plan:  1. Weight loss - Patient has not met goal of at least 5% of body weight loss with comprehensive lifestyle modifications alone in the past 3-6 months. Pharmacotherapy is appropriate to pursue as augmentation. Will start mounjaro coverage assessment. Confirmed patient is not pregnant and no personal or family history of medullary thyroid  carcinoma (MTC) or Multiple Endocrine Neoplasia syndrome type 2 (MEN 2). Injection technique  reviewed at today's visit.  Advised patient on common side effects including nausea, diarrhea, dyspepsia, decreased appetite, and fatigue. Counseled patient on reducing meal size and how to titrate medication to minimize side effects. Counseled patient to call if intolerable side effects or if experiencing dehydration, abdominal pain, or dizziness. Patient will adhere to dietary modifications and will target at least 150 minutes of moderate intensity exercise weekly.   Follow up in 1 month via telephone for tolerability update and dose titration.

## 2023-08-22 NOTE — Telephone Encounter (Signed)
 Per test claim plan covers Mounjaro without a PA. $25 copay for 1 month.

## 2023-08-22 NOTE — Telephone Encounter (Signed)
 Pharmacy Patient Advocate Encounter   Received notification from Physician's Office that prior authorization for MOUNJARO is required/requested.   Insurance verification completed.   The patient is insured through  [facility]  .   Per test claim: The current 28 day co-pay is, $25.  No PA needed at this time. This test claim was processed through [facility]- copay amounts may vary at other pharmacies due to pharmacy/plan contracts, or as the patient moves through the different stages of their insurance plan.

## 2023-08-24 NOTE — Telephone Encounter (Signed)
 Prescription sent see other office visit encounter for more info.

## 2023-09-08 ENCOUNTER — Telehealth: Admitting: Emergency Medicine

## 2023-09-08 DIAGNOSIS — R3989 Other symptoms and signs involving the genitourinary system: Secondary | ICD-10-CM

## 2023-09-08 MED ORDER — CEPHALEXIN 500 MG PO CAPS
500.0000 mg | ORAL_CAPSULE | Freq: Two times a day (BID) | ORAL | 0 refills | Status: AC
Start: 2023-09-08 — End: 2023-09-15

## 2023-09-08 NOTE — Patient Instructions (Signed)
 Amber Stein, thank you for joining Blinda Burger, NP for today's virtual visit.  While this provider is not your primary care provider (PCP), if your PCP is located in our provider database this encounter information will be shared with them immediately following your visit.   A [facility] MyChart account gives you access to today's visit and all your visits, tests, and labs performed at [facility] " click here if you don't have a [facility] MyChart account or go to [email or web address]  Consent: (Patient) Amber Stein provided verbal consent for this virtual visit at the beginning of the encounter.  Current Medications:  Current Outpatient Medications:    cephALEXin (KEFLEX) 500 MG capsule, Take 1 capsule (500 mg total) by mouth 2 (two) times daily for 7 days., Disp: 14 capsule, Rfl: 0   carvedilol  (COREG ) 6.25 MG tablet, Take 1 tablet (6.25 mg total) by mouth 2 (two) times daily with a meal., Disp: 90 tablet, Rfl: 3   cetirizine (ZYRTEC) 10 MG tablet, Take 10 mg by mouth daily., Disp: , Rfl:    empagliflozin  (JARDIANCE ) 10 MG TABS tablet, Take 1 tablet (10 mg total) by mouth daily before breakfast., Disp: 90 tablet, Rfl: 3   furosemide  (LASIX ) 40 MG tablet, Take 1 tablet (40 mg total) by mouth daily., Disp: , Rfl:    potassium chloride  SA (KLOR-CON  M) 20 MEQ tablet, Take 1 tablet (20 mEq total) by mouth daily., Disp: 90 tablet, Rfl: 3   rosuvastatin  (CRESTOR ) 5 MG tablet, Take 1 tablet (5 mg total) by mouth daily., Disp: 30 tablet, Rfl: 11   sacubitril -valsartan  (ENTRESTO ) 97-103 MG, Take 1 tablet by mouth 2 (two) times daily., Disp: 60 tablet, Rfl: 11   spironolactone  (ALDACTONE ) 25 MG tablet, TAKE 1 TABLET(25 MG) BY MOUTH DAILY, Disp: 90 tablet, Rfl: 3   tirzepatide (MOUNJARO) 2.5 MG/0.5ML Pen, Inject 2.5 mg into the skin once a week., Disp: 2 mL, Rfl: 0   Medications ordered in this encounter:  Meds ordered this encounter  Medications    cephALEXin (KEFLEX) 500 MG capsule    Sig: Take 1 capsule (500 mg total) by mouth 2 (two) times daily for 7 days.    Dispense:  14 capsule    Refill:  0     *If you need refills on other medications prior to your next appointment, please contact your pharmacy*  Follow-Up: Call back or seek an in-person evaluation if the symptoms worsen or if the condition fails to improve as anticipated.  [facility] [phone]  Other Instructions Continue drinking water and cranberry juice is fine to use also.  I recommend the AZO version that contains phenazopyridine (the stuff that turns your urine orange) for comfort.  If you develop worsening symptoms, fever, chills, vomiting, worsening back pain, then please seek in person care   If you have been instructed to have an in-person evaluation today at a local Urgent Care facility, please use the link below. It will take you to a list of all of our available [facility], including address, phone number and hours of operation. Please do not delay care.  [facility]  If you or a family member do not have a primary care provider, use the link below to schedule a visit and establish care. When you choose a [facility] primary care physician or advanced practice provider, you gain a long-term partner in health. Find a Primary Care Provider  Learn more about [facility]'s in-office and virtual care options: [place] - Get Care Now

## 2023-09-08 NOTE — Progress Notes (Signed)
 Virtual Visit Consent   Amber Stein, you are scheduled for a virtual visit with a [facility] provider today. Just as with appointments in the office, your consent must be obtained to participate. Your consent will be active for this visit and any virtual visit you may have with one of our providers in the next 365 days. If you have a MyChart account, a copy of this consent can be sent to you electronically.  As this is a virtual visit, video technology does not allow for your provider to perform a traditional examination. This may limit your provider's ability to fully assess your condition. If your provider identifies any concerns that need to be evaluated in person or the need to arrange testing (such as labs, EKG, etc.), we will make arrangements to do so. Although advances in technology are sophisticated, we cannot ensure that it will always work on either your end or our end. If the connection with a video visit is poor, the visit may have to be switched to a telephone visit. With either a video or telephone visit, we are not always able to ensure that we have a secure connection.  By engaging in this virtual visit, you consent to the provision of healthcare and authorize for your insurance to be billed (if applicable) for the services provided during this visit. Depending on your insurance coverage, you may receive a charge related to this service.  I need to obtain your verbal consent now. Are you willing to proceed with your visit today? Amber Stein has provided verbal consent on 09/08/2023 for a virtual visit (video or telephone). Blinda Burger, NP  Date: 09/08/2023 5:15 PM   Virtual Visit via Video Note   I, Blinda Burger, connected with  Amber Stein  ([id number], [birth year]) on 09/08/23 at  5:15 PM EDT by a video-enabled telemedicine application and verified that I am speaking with the correct person using two identifiers.  Location: Patient: Virtual Visit  Location Patient: Home Provider: Virtual Visit Location Provider: Home Office   I discussed the limitations of evaluation and management by telemedicine and the availability of in person appointments. The patient expressed understanding and agreed to proceed.    History of Present Illness: Amber Stein is a 43 y.o. who identifies as a female who was assigned female at birth, and is being seen today for Discomfort and pain and burning with urination.  Started 4 days ago.  Also has urinary frequency and sometimes when she voids she only gets small amounts, so she feels the need to urinate but there is not any urine to void. No hematuria.  Denies fever or chills, no abd pain, n/v.  Does have some mild back pain, worse on the right than the left.  Has been taking some kind of flavor of AZO for symptoms.  The kind she is taking does not turn her urine orange and she does not feel like it is relieving her symptoms at all.  Has not had a urinary tract infection in a while but has had 1 before, and that foot this feels like to her  Took azo pain relief - didn't help much  HPI: HPI  Problems:  Patient Active Problem List   Diagnosis Date Noted   Chronic systolic CHF (congestive heart failure) (HCC) 12/15/2022   Mitral valve insufficiency 12/15/2022   Class 3 severe obesity due to excess calories with serious comorbidity and body mass index (BMI) of 40.0 to 44.9 in  adult 12/15/2022   Abnormal EKG 05/09/2022   Elevated liver enzymes 05/05/2022   Abdominal pain 03/31/2022    Allergies: No Known Allergies Medications:  Current Outpatient Medications:    cephALEXin (KEFLEX) 500 MG capsule, Take 1 capsule (500 mg total) by mouth 2 (two) times daily for 7 days., Disp: 14 capsule, Rfl: 0   carvedilol  (COREG ) 6.25 MG tablet, Take 1 tablet (6.25 mg total) by mouth 2 (two) times daily with a meal., Disp: 90 tablet, Rfl: 3   cetirizine (ZYRTEC) 10 MG tablet, Take 10 mg by mouth daily., Disp: , Rfl:     empagliflozin  (JARDIANCE ) 10 MG TABS tablet, Take 1 tablet (10 mg total) by mouth daily before breakfast., Disp: 90 tablet, Rfl: 3   furosemide  (LASIX ) 40 MG tablet, Take 1 tablet (40 mg total) by mouth daily., Disp: , Rfl:    potassium chloride  SA (KLOR-CON  M) 20 MEQ tablet, Take 1 tablet (20 mEq total) by mouth daily., Disp: 90 tablet, Rfl: 3   rosuvastatin  (CRESTOR ) 5 MG tablet, Take 1 tablet (5 mg total) by mouth daily., Disp: 30 tablet, Rfl: 11   sacubitril -valsartan  (ENTRESTO ) 97-103 MG, Take 1 tablet by mouth 2 (two) times daily., Disp: 60 tablet, Rfl: 11   spironolactone  (ALDACTONE ) 25 MG tablet, TAKE 1 TABLET(25 MG) BY MOUTH DAILY, Disp: 90 tablet, Rfl: 3   tirzepatide (MOUNJARO) 2.5 MG/0.5ML Pen, Inject 2.5 mg into the skin once a week., Disp: 2 mL, Rfl: 0  Observations/Objective: Patient is well-developed, well-nourished in no acute distress.  Resting comfortably  at home.  Head is normocephalic, atraumatic.  No labored breathing.  Speech is clear and coherent with logical content.  Patient is alert and oriented at baseline.    Assessment and Plan: 1. Suspected UTI (Primary)  Recommend the AZO version that contains from phenazopyridine.  Prescribed Keflex.  Follow Up Instructions: I discussed the assessment and treatment plan with the patient. The patient was provided an opportunity to ask questions and all were answered. The patient agreed with the plan and demonstrated an understanding of the instructions.  A copy of instructions were sent to the patient via MyChart unless otherwise noted below.   The patient was advised to call back or seek an in-person evaluation if the symptoms worsen or if the condition fails to improve as anticipated.    Blinda Burger, NP

## 2023-09-14 ENCOUNTER — Ambulatory Visit (HOSPITAL_COMMUNITY)
Admission: RE | Admit: 2023-09-14 | Discharge: 2023-09-14 | Disposition: A | Discharge status: Home / Self Care | Source: Ambulatory Visit | Attending: Cardiology | Admitting: Cardiology

## 2023-09-14 ENCOUNTER — Telehealth: Payer: Self-pay | Admitting: Pharmacist

## 2023-09-14 ENCOUNTER — Other Ambulatory Visit (HOSPITAL_COMMUNITY): Payer: Self-pay | Admitting: Cardiology

## 2023-09-14 VITALS — BP 121/88 | HR 81 | Wt 268.0 lb

## 2023-09-14 DIAGNOSIS — E669 Obesity, unspecified: Secondary | ICD-10-CM | POA: Insufficient documentation

## 2023-09-14 DIAGNOSIS — K761 Chronic passive congestion of liver: Secondary | ICD-10-CM | POA: Insufficient documentation

## 2023-09-14 DIAGNOSIS — R0609 Other forms of dyspnea: Secondary | ICD-10-CM | POA: Diagnosis not present

## 2023-09-14 DIAGNOSIS — I428 Other cardiomyopathies: Secondary | ICD-10-CM | POA: Insufficient documentation

## 2023-09-14 DIAGNOSIS — Z79899 Other long term (current) drug therapy: Secondary | ICD-10-CM | POA: Diagnosis not present

## 2023-09-14 DIAGNOSIS — K219 Gastro-esophageal reflux disease without esophagitis: Secondary | ICD-10-CM | POA: Insufficient documentation

## 2023-09-14 DIAGNOSIS — I340 Nonrheumatic mitral (valve) insufficiency: Secondary | ICD-10-CM | POA: Diagnosis not present

## 2023-09-14 DIAGNOSIS — E785 Hyperlipidemia, unspecified: Secondary | ICD-10-CM | POA: Diagnosis not present

## 2023-09-14 DIAGNOSIS — R7989 Other specified abnormal findings of blood chemistry: Secondary | ICD-10-CM | POA: Insufficient documentation

## 2023-09-14 DIAGNOSIS — Z7984 Long term (current) use of oral hypoglycemic drugs: Secondary | ICD-10-CM | POA: Insufficient documentation

## 2023-09-14 DIAGNOSIS — I5022 Chronic systolic (congestive) heart failure: Secondary | ICD-10-CM | POA: Diagnosis present

## 2023-09-14 DIAGNOSIS — Z6841 Body Mass Index (BMI) 40.0 and over, adult: Secondary | ICD-10-CM | POA: Insufficient documentation

## 2023-09-14 LAB — BASIC METABOLIC PANEL WITH GFR
Anion gap: 8 (ref 5–15)
BUN: 29 mg/dL — ABNORMAL HIGH (ref 6–20)
CO2: 27 mmol/L (ref 22–32)
Calcium: 9.5 mg/dL (ref 8.9–10.3)
Chloride: 100 mmol/L (ref 98–111)
Creatinine, Ser: 0.99 mg/dL (ref 0.44–1.00)
GFR, Estimated: >60 mL/min (ref >60)
Glucose, Bld: 90 mg/dL (ref 70–99)
Potassium: 4.9 mmol/L (ref 3.5–5.1)
Sodium: 135 mmol/L (ref 135–145)

## 2023-09-14 MED ORDER — ENTRESTO 97-103 MG PO TABS
1.0000 | ORAL_TABLET | Freq: Two times a day (BID) | ORAL | 3 refills | Status: DC
Start: 2023-09-14 — End: 2024-03-03

## 2023-09-14 MED ORDER — PANTOPRAZOLE SODIUM 40 MG PO TBEC: 40.0000 mg | DELAYED_RELEASE_TABLET | Freq: Every day | ORAL | Status: DC

## 2023-09-14 MED ORDER — CARVEDILOL 12.5 MG PO TABS
12.5000 mg | ORAL_TABLET | Freq: Two times a day (BID) | ORAL | 3 refills | Status: DC
Start: 2023-09-14 — End: 2023-11-07

## 2023-09-14 MED ORDER — MOUNJARO 5 MG/0.5ML ~~LOC~~ SOAJ: 5.0000 mg | SUBCUTANEOUS | 0 refills | Status: DC

## 2023-09-14 MED ORDER — EMPAGLIFLOZIN 10 MG PO TABS: 10.0000 mg | ORAL_TABLET | Freq: Every day | ORAL | 3 refills | Status: AC

## 2023-09-14 NOTE — Telephone Encounter (Signed)
 Call to titrate Mounjaro dose. Reports she has been tolerating the 2.5 mg dose well and ready to move on to the next dose step. Has been implementing healthy diet plan and doing light exercises. Prescription for 5 mg Mounjaro sent to the pharmacy. F/u in 4 weeks for further dose titration.

## 2023-09-14 NOTE — Patient Instructions (Addendum)
 INCREASE Carvedilol  to 12.5 mg Twice daily  START Pantoprazole  40 mg daily. You can buy this over the counter.  Labs done today, your results will be available in MyChart, we will contact you for abnormal readings.  Your physician recommends that you schedule a follow-up appointment in: 6 months.  If you have any questions or concerns before your next appointment please send us  a message through [facility] or call our office at [phone].    TO LEAVE A MESSAGE FOR THE NURSE SELECT OPTION 2, PLEASE LEAVE A MESSAGE INCLUDING: YOUR NAME DATE OF BIRTH CALL BACK NUMBER REASON FOR CALL**this is important as we prioritize the call backs  YOU WILL RECEIVE A CALL BACK THE SAME DAY AS LONG AS YOU CALL BEFORE 4:00 PM  At the [facility], you and your health needs are our priority. As part of our continuing mission to provide you with exceptional heart care, we have created designated Provider Care Teams. These Care Teams include your primary Cardiologist (physician) and Advanced Practice Providers (APPs- Physician Assistants and Nurse Practitioners) who all work together to provide you with the care you need, when you need it.   You may see any of the following providers on your designated Care Team at your next follow up: Dr Jules Oar Dr Peder Bourdon Dr. Alwin Baars Dr. Arta Lark Amy Marijane Shoulders, NP Ruddy Corral, [place] [facility] [place], [place] Dennise Fitz, NP [place] Lee, NP Shawnee Dellen, NP Luster Salters, PharmD Bevely Brush, PharmD   Please be sure to bring in all your medications bottles to every appointment.    Thank you for choosing [facility]

## 2023-09-15 ENCOUNTER — Other Ambulatory Visit: Payer: Self-pay | Admitting: Student

## 2023-09-15 ENCOUNTER — Telehealth: Payer: Self-pay | Admitting: Student

## 2023-09-15 MED ORDER — PANTOPRAZOLE SODIUM 40 MG PO TBEC
40.0000 mg | DELAYED_RELEASE_TABLET | Freq: Every day | ORAL | 1 refills | Status: DC
Start: 2023-09-15 — End: 2023-09-17

## 2023-09-15 NOTE — Telephone Encounter (Signed)
   Patient called Answering Service requesting prescription of Protonix . Called and spoke with patient. She was seen by Dr. Mitzie Anda yesterday. I am unable to see the full office visit note at this time but I can see the AVS which instructs patient to start Protonix  40mg  daily. It looks like this got ordered as OTC. Will send prescription to patient's preferred Pharmacy.  Melquan Ernsberger E Jenniah Bhavsar, PA-C 09/15/2023 11:41 AM

## 2023-09-16 ENCOUNTER — Ambulatory Visit (HOSPITAL_COMMUNITY): Payer: Self-pay | Admitting: Cardiology

## 2023-09-16 NOTE — Progress Notes (Signed)
 PCP: Amber Mulder, DO Cardiology: Dr. Avanell Stein HF Cardiology: Dr. Mitzie Stein  Chief complaint: CHF  43 y.o. with history of cardiomyopathy was referred by Dr. Avanell Stein for evaluation of CHF.  In 12/23, patient developed abdominal pain, nausea, vomiting, and diarrhea. RUQ US  showed fatty liver.  She does not drink ETOH.  She was thought to have NAFLD.  She also noted the development of significant exertional dyspnea with most activities, even just walking around the house.  Echo was done in 1/24 showing EF 20-25%, severe functional MR, mild RV dysfunction. ECG showed LBBB.  She was started on Lasix  for diuresis.  RHC/LHC in 1/24 showed no CAD, elevated filling pressures and low but not markedly low cardiac output.     Cardiac MRI in 5/24 showed EF 35%, septal-lateral dyssynchrony, RV EF 31%, no LGE, moderate MR with regurgitant fraction 26%. Echo in 7/24 showed EF 35-40%, septal-lateral dyssynchrony, normal RV, mild-moderate MR.   Cardiac MRI in 1/25 showed LV EF 52%, septal-lateral dyssynchrony, RV EF 42%, no LGE, ECV 26%.   Today she returns for HF follow up.  She has just started on Mounjaro.  Weight is stable.  She has dyspnea walking up stairs.  No problems on flat ground.  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  No orthopnea/PND.  She gets lightheaded if she bends over then stands up too fast.  She gets GERD symptoms after meals.   ECG (personally reviewed): NSR, LBBB 146 msec  Labs (2/24): Pro-BNP 1519, K 3.7, creatinine 0.93 Labs (3/24): K 4.6, creatinine 0.83, AST 42, ALT 50 Labs (4/24): BNP 43, K 4.3, creatinine 0.83 Labs (5/24): LDL 151 Labs (7/24): K 4.6, creatinine 0.81, normal LFTs Labs (10/24): TSH normal, BNP 21, K 4.3, creatinine 0.8 Labs (12/24): LFTs normal, LDL 98 Labs (2/25): BNP 19.9 Labs (4/25): LDL 88, K 4.2, creatinine 1.61  PMH:  1. LBBB 2. Chronic systolic CHF: Echo (1/24) with EF 20-25%, severe functional MR, mild RV dysfunction.   - LHC/RHC (1/24): No significant CAD; mean RA 15, PA 58/21 mean 37, mean PCWP 26, CI 2.18, PVR 2.3 WU, PAPi 2.5.  - Cardiac MRI in 5/24: EF 35%, septal-lateral dyssynchrony, RV EF 31%, no LGE, moderate MR with regurgitant fraction 26%.  - Echo (7/24): EF 35-40%, septal-lateral dyssynchrony, normal RV, mild-moderate MR.  - Cardiac MRI (1/25): LV EF 52%, septal-lateral dyssynchrony, RV EF 42%, no LGE, ECV 26% 3. Mitral regurgitation: Suspect functional, severe on 1/24 echo.  4. NAFLD: Chronic elevated LFTs.  - Abdominal US  (1/24): Fatty liver.  5. Zio monitor 11/24 with no significant arrhythmias.   SH: Married, lives in [place], works as Teacher, early years/pre, no drugs, no ETOH, no smoking.   FH: No family history of CHF or sudden death.   ROS: All systems reviewed and negative except as per HPI.   Current Outpatient Medications  Medication Sig Dispense Refill   cetirizine (ZYRTEC) 10 MG tablet Take 10 mg by mouth daily.     furosemide  (LASIX ) 40 MG tablet Take 1 tablet (40 mg total) by mouth daily.     potassium chloride  SA (KLOR-CON  M) 20 MEQ tablet Take 1 tablet (20 mEq total) by mouth daily. 90 tablet 3   rosuvastatin  (CRESTOR ) 5 MG tablet Take 1 tablet (5 mg total) by mouth daily. 30 tablet 11   spironolactone  (ALDACTONE ) 25 MG tablet TAKE 1 TABLET(25 MG) BY MOUTH DAILY 90 tablet 3   tirzepatide (MOUNJARO) 5 MG/0.5ML  Pen Inject 5 mg into the skin once a week. 2 mL 0   carvedilol  (COREG ) 12.5 MG tablet Take 1 tablet (12.5 mg total) by mouth 2 (two) times daily with a meal. 180 tablet 3   empagliflozin  (JARDIANCE ) 10 MG TABS tablet Take 1 tablet (10 mg total) by mouth daily. 90 tablet 3   pantoprazole  (PROTONIX ) 40 MG tablet Take 1 tablet (40 mg total) by mouth daily. 30 tablet 1   sacubitril -valsartan  (ENTRESTO ) 97-103 MG Take 1 tablet by mouth 2 (two) times daily. 180 tablet 3   No current facility-administered medications for this encounter.   Wt Readings from Last 3 Encounters:   09/14/23 121.6 kg (268 lb)  08/22/23 122.4 kg (269 lb 12.8 oz)  08/10/23 122.9 kg (271 lb)   BP 121/88   Pulse 81   Wt 121.6 kg (268 lb)   SpO2 96%   BMI 47.47 kg/m  General: NAD, obese.  Neck: No JVD, no thyromegaly or thyroid  nodule.  Lungs: Clear to auscultation bilaterally with normal respiratory effort. CV: Nondisplaced PMI.  Heart regular S1/S2, no S3/S4, no murmur.  No peripheral edema.  No carotid bruit.  Normal pedal pulses.  Abdomen: Soft, nontender, no hepatosplenomegaly, no distention.  Skin: Intact without lesions or rashes.  Neurologic: Alert and oriented x 3.  Psych: Normal affect. Extremities: No clubbing or cyanosis.  HEENT: Normal.   Assessment/Plan: 1. Chronic systolic CHF: Nonischemic cardiomyopathy.  Echo in 1/24 showed EF 20-25%, severe functional MR, mild RV dysfunction. ECG showed LBBB.  RHC/LHC in 1/24 showed no CAD, elevated filling pressures and low but not markedly low cardiac output. No ETOH or drug history.  TSH normal.  No family history of cardiomyopathy or sudden death.  ?Viral myocarditis as etiology or LBBB cardiomyopathy.  Cardiac MRI in 5/24 showed EF 35%, septal-lateral dyssynchrony, RV EF 31%, no LGE, moderate MR with regurgitant fraction 26%. Echo 7/24 showed EF 35-40%, septal-lateral dyssynchrony, normal RV, mild-moderate MR. Cardiac MRI in 1/25 with improvement, LV EF 52%, septal-lateral dyssynchrony, RV EF 42%, no LGE, ECV 26%.  NYHA class II, not volume overloaded on exam.  - Increase Coreg  to 12.5 mg bid.  - Continue Entresto  97/103 bid.  - Continue Lasix  40 mg daily. BMET today.  - Continue spironolactone  25 mg daily. - Continue Jardiance  10 mg daily.  - EF is out of range for CRT-D.  - Encouraged contraception, discussed feto-toxicity of GDMT.  2. Mitral regurgitation: Severe on 1/24 echo though she does not have a loud murmur.  Suspect functional.  Echo 7/24 showed mild-moderate MR.  3. Elevated LFTs: Fatty liver on US , suspect NAFLD.   Congestive hepatopathy likely plays a role.  Most recent LFTs normalized. She does not drink a significant amount of ETOH or regularly use acetaminophen  4. Hyperlipidemia: Continue Crestor , 12/24 LDL improved.   5. Obesity: Body mass index is 47.47 kg/m. - Continue Mounjaro.  6. GERD: She can try Protonix  40 mg daily.   Followup in 6 months with APP  I spent 32 minutes reviewing records, interviewing/examining patient, and managing orders.   Peder Bourdon  09/16/2023

## 2023-10-12 ENCOUNTER — Other Ambulatory Visit: Payer: Self-pay | Admitting: Cardiology

## 2023-10-16 ENCOUNTER — Telehealth: Payer: Self-pay | Admitting: Pharmacist

## 2023-10-16 NOTE — Telephone Encounter (Signed)
*  STAT* If patient is at the pharmacy, call can be transferred to refill team.   1. Which medications need to be refilled? (please list name of each medication and dose if known)   tirzepatide  (MOUNJARO ) 5 MG/0.5ML Pen   2. Would you like to learn more about the convenience, safety, & potential cost savings by using the [facility]?   3. Are you open to using the [facility] (Type [facility]. ).  4. Which pharmacy/location (including street and city if local pharmacy) is medication to be sent to?  [id number] - [place], [place] - [address]   5. Do they need a 30 day or 90 day supply?   Patient stated she is completely out of this medication.   Patient also noted she will need a dosage change.

## 2023-10-17 MED ORDER — MOUNJARO 7.5 MG/0.5ML ~~LOC~~ SOAJ: 7.5000 mg | SUBCUTANEOUS | 0 refills | Status: DC

## 2023-10-17 NOTE — Telephone Encounter (Signed)
 Spoke to patient, reports she tolerates Mounjaro  5 mg dose well and ready to increase the dose to 7.5 mg once a week. F/u in 4 weeks

## 2023-10-26 ENCOUNTER — Telehealth (HOSPITAL_COMMUNITY): Payer: Self-pay

## 2023-10-26 NOTE — Telephone Encounter (Signed)
 Spoke with patient who states that she needs her work letter extended out past 6/30- reports that the other restrictions are okay. Advised patient I would forward message to ensure this is okay- she reports that letter can be sent to her MyChart or placed at front desk.

## 2023-10-26 NOTE — Telephone Encounter (Signed)
 Received voicemail from patient on triage line that states that she needs her work restrictions letter to be pushed out past the end date of 10/29/23  Attempted to call patient, left message for patient to call back to office.

## 2023-10-29 NOTE — Telephone Encounter (Signed)
Letter updated and sent via my chart as requested.

## 2023-11-05 ENCOUNTER — Telehealth: Payer: Self-pay | Admitting: Cardiology

## 2023-11-05 NOTE — Telephone Encounter (Signed)
*  STAT* If patient is at the pharmacy, call can be transferred to refill team.   1. Which medications need to be refilled? (please list name of each medication and dose if known)  tirzepatide  (MOUNJARO ) 7.5 MG/0.5ML Pen  2. Which pharmacy/location (including street and city if local pharmacy) is medication to be sent to? [id number] - [place], [place] - [address]  3. Do they need a 30 day or 90 day supply?  90 day supply

## 2023-11-07 ENCOUNTER — Telehealth (HOSPITAL_COMMUNITY): Payer: Self-pay | Admitting: Cardiology

## 2023-11-07 MED ORDER — CARVEDILOL 6.25 MG PO TABS: 6.2500 mg | ORAL_TABLET | Freq: Two times a day (BID) | ORAL | 3 refills | Status: AC

## 2023-11-07 MED ORDER — MOUNJARO 7.5 MG/0.5ML ~~LOC~~ SOAJ: 7.5000 mg | SUBCUTANEOUS | 0 refills | Status: DC

## 2023-11-07 NOTE — Telephone Encounter (Signed)
 Pt aware and voiced understanding

## 2023-11-07 NOTE — Addendum Note (Signed)
 Addended by: Donia Yokum, DALTON HERO on: 11/07/2023 04:38 PM   Modules accepted: Orders

## 2023-11-07 NOTE — Telephone Encounter (Signed)
 Patient called to report increase in dizziness with increase in medication (5/16 coreg  to 12.5 BID)  Reports increase in fatigue,heaviness in shoulders, light headiness, dizziness, low b/p, visual changes (seeing spots)  Reports above symptoms all started shortly after med increase, has been monitoring and no improvements  B/p stable at 91/60 Weight no changes Denies CP, SOB   Please advise

## 2023-11-07 NOTE — Telephone Encounter (Signed)
 Please call.  Instruct to hold even dose of coreg . Tomorrow start coreg  6.25 mg twice a day   Lailani Tool NP-C  3:38 PM

## 2023-11-09 ENCOUNTER — Other Ambulatory Visit (HOSPITAL_COMMUNITY): Payer: Self-pay

## 2023-11-09 ENCOUNTER — Telehealth: Payer: Self-pay | Admitting: Pharmacy Technician

## 2023-11-09 NOTE — Telephone Encounter (Signed)
 Hi, Insurance is saying the requested qty of 59ml/28ds is not covered by the plan. They will still pay for mounjaro  5mg  for 52ml/28ds for 25.00 a month. I'm sending to you since you last talked to him.

## 2023-11-26 ENCOUNTER — Other Ambulatory Visit (HOSPITAL_COMMUNITY): Payer: Self-pay | Admitting: Family Medicine

## 2023-12-04 ENCOUNTER — Encounter (HOSPITAL_COMMUNITY): Payer: Self-pay

## 2023-12-21 ENCOUNTER — Telehealth: Payer: Self-pay | Admitting: Pharmacist

## 2023-12-21 DIAGNOSIS — I5022 Chronic systolic (congestive) heart failure: Secondary | ICD-10-CM

## 2023-12-21 NOTE — Telephone Encounter (Signed)
 Pt c/o medication issue:  1. Name of Medication: tirzepatide  (MOUNJARO ) 7.5 MG/0.5ML Pen   2. How are you currently taking this medication (dosage and times per day)? As written   3. Are you having a reaction (difficulty breathing--STAT)? No   4. What is your medication issue? Pt states she was told to ask for a dosage increase to 10 MG. Please advise

## 2023-12-24 MED ORDER — TIRZEPATIDE 10 MG/0.5ML ~~LOC~~ SOAJ: 10.0000 mg | SUBCUTANEOUS | 0 refills | Status: DC

## 2024-01-07 ENCOUNTER — Other Ambulatory Visit: Payer: Self-pay

## 2024-01-15 ENCOUNTER — Other Ambulatory Visit: Payer: Self-pay | Admitting: Cardiology

## 2024-01-15 DIAGNOSIS — I5022 Chronic systolic (congestive) heart failure: Secondary | ICD-10-CM

## 2024-01-17 ENCOUNTER — Other Ambulatory Visit (HOSPITAL_COMMUNITY): Payer: Self-pay

## 2024-01-17 ENCOUNTER — Telehealth: Payer: Self-pay | Admitting: Pharmacy Technician

## 2024-01-17 NOTE — Telephone Encounter (Signed)
  Unable to do test claim since we are not contracted with insurance Per pa: not needed I called [facility] and asked them to run it again

## 2024-01-24 ENCOUNTER — Other Ambulatory Visit: Payer: Self-pay

## 2024-01-24 MED ORDER — FUROSEMIDE 40 MG PO TABS: 40.0000 mg | ORAL_TABLET | Freq: Every day | ORAL | 0 refills | Status: DC

## 2024-01-28 ENCOUNTER — Other Ambulatory Visit (HOSPITAL_COMMUNITY): Payer: Self-pay | Admitting: Cardiology

## 2024-01-28 MED ORDER — FUROSEMIDE 40 MG PO TABS: 40.0000 mg | ORAL_TABLET | Freq: Every day | ORAL | 6 refills | Status: DC

## 2024-02-05 ENCOUNTER — Other Ambulatory Visit: Payer: Self-pay | Admitting: Obstetrics and Gynecology

## 2024-02-05 DIAGNOSIS — Z1231 Encounter for screening mammogram for malignant neoplasm of breast: Secondary | ICD-10-CM

## 2024-02-06 ENCOUNTER — Telehealth (HOSPITAL_COMMUNITY): Payer: Self-pay

## 2024-02-06 NOTE — Telephone Encounter (Signed)
 Received a fax requesting medical records from [place]  [employer]. Records were successfully faxed to: [phone] ,which was the number provided.. Medical request form will be scanned into patients chart.    Phone number: [phone]

## 2024-02-14 ENCOUNTER — Other Ambulatory Visit (HOSPITAL_COMMUNITY): Payer: Self-pay | Admitting: Cardiology

## 2024-02-14 DIAGNOSIS — I5022 Chronic systolic (congestive) heart failure: Secondary | ICD-10-CM

## 2024-02-14 NOTE — Telephone Encounter (Signed)
 Patient called to request refill of mounjaro   Will route to cardiology pharmD to ensure correct dose is sent

## 2024-02-15 NOTE — Telephone Encounter (Signed)
 Patient called again to request refills  Again advised basedd on medication type,refills are primarily addressed by pharmacy team to ensure correct dose is return for refills. Will sent to pharmD

## 2024-02-18 MED ORDER — MOUNJARO 12.5 MG/0.5ML ~~LOC~~ SOAJ: 12.5000 mg | SUBCUTANEOUS | 0 refills | Status: DC

## 2024-02-24 ENCOUNTER — Telehealth: Payer: Self-pay | Admitting: Physician Assistant

## 2024-02-24 NOTE — Telephone Encounter (Signed)
 I think she should restart KCl 10 mEq daily until her followup.  Please send in.

## 2024-02-24 NOTE — Telephone Encounter (Signed)
 Patient called to request refill on Klor Con 20 MEQ daily and note she currently has 3 pills left. Most recent K 4.9 in 08/2023. With K on the high side of normal, would prefer to recheck labs prior to sending refill. Patient is not able to come in for labs due to work schedule until upcoming cardiology appointment on 11/3. Notified patient that I will forward message to Dr. Mclean for review for further recommendation on how to proceed.

## 2024-02-25 ENCOUNTER — Other Ambulatory Visit (HOSPITAL_COMMUNITY): Payer: Self-pay

## 2024-02-25 MED ORDER — POTASSIUM CHLORIDE CRYS ER 10 MEQ PO TBCR: 10.0000 meq | EXTENDED_RELEASE_TABLET | Freq: Every day | ORAL | 3 refills | Status: AC

## 2024-02-29 ENCOUNTER — Other Ambulatory Visit (HOSPITAL_COMMUNITY): Payer: Self-pay | Admitting: Family Medicine

## 2024-02-29 ENCOUNTER — Telehealth (HOSPITAL_COMMUNITY): Payer: Self-pay

## 2024-02-29 NOTE — Telephone Encounter (Signed)
 Called to confirm/remind patient of their appointment at [facility] on 03[facility].   Appointment:   [x] Confirmed  [] Left mess   [] No answer/No voice mail  [] VM Full/unable to leave message  [] Phone not in service  Patient reminded to bring all medications and/or complete list.  Confirmed patient has transportation. Gave directions, instructed to utilize valet parking.

## 2024-03-03 ENCOUNTER — Ambulatory Visit (HOSPITAL_COMMUNITY)
Admission: RE | Admit: 2024-03-03 | Discharge: 2024-03-03 | Disposition: A | Discharge status: Home / Self Care | Source: Ambulatory Visit | Attending: Family Medicine | Admitting: Family Medicine

## 2024-03-03 ENCOUNTER — Encounter (HOSPITAL_COMMUNITY): Payer: Self-pay

## 2024-03-03 VITALS — BP 90/68 | HR 93 | Wt 231.2 lb

## 2024-03-03 DIAGNOSIS — R7989 Other specified abnormal findings of blood chemistry: Secondary | ICD-10-CM | POA: Insufficient documentation

## 2024-03-03 DIAGNOSIS — K760 Fatty (change of) liver, not elsewhere classified: Secondary | ICD-10-CM | POA: Diagnosis not present

## 2024-03-03 DIAGNOSIS — I428 Other cardiomyopathies: Secondary | ICD-10-CM | POA: Insufficient documentation

## 2024-03-03 DIAGNOSIS — I340 Nonrheumatic mitral (valve) insufficiency: Secondary | ICD-10-CM | POA: Insufficient documentation

## 2024-03-03 DIAGNOSIS — Z79899 Other long term (current) drug therapy: Secondary | ICD-10-CM | POA: Diagnosis not present

## 2024-03-03 DIAGNOSIS — I447 Left bundle-branch block, unspecified: Secondary | ICD-10-CM | POA: Diagnosis not present

## 2024-03-03 DIAGNOSIS — Z7984 Long term (current) use of oral hypoglycemic drugs: Secondary | ICD-10-CM | POA: Diagnosis not present

## 2024-03-03 DIAGNOSIS — E785 Hyperlipidemia, unspecified: Secondary | ICD-10-CM | POA: Diagnosis not present

## 2024-03-03 DIAGNOSIS — I509 Heart failure, unspecified: Secondary | ICD-10-CM | POA: Diagnosis present

## 2024-03-03 DIAGNOSIS — E782 Mixed hyperlipidemia: Secondary | ICD-10-CM

## 2024-03-03 DIAGNOSIS — Z6841 Body Mass Index (BMI) 40.0 and over, adult: Secondary | ICD-10-CM | POA: Insufficient documentation

## 2024-03-03 DIAGNOSIS — I5022 Chronic systolic (congestive) heart failure: Secondary | ICD-10-CM | POA: Diagnosis not present

## 2024-03-03 DIAGNOSIS — E669 Obesity, unspecified: Secondary | ICD-10-CM | POA: Insufficient documentation

## 2024-03-03 LAB — COMPREHENSIVE METABOLIC PANEL WITH GFR
ALT: 32 U/L (ref 0–44)
AST: 20 U/L (ref 15–41)
Albumin: 4.1 g/dL (ref 3.5–5.0)
Alkaline Phosphatase: 58 U/L (ref 38–126)
Anion gap: 7 (ref 5–15)
BUN: 18 mg/dL (ref 6–20)
CO2: 25 mmol/L (ref 22–32)
Calcium: 8.8 mg/dL — ABNORMAL LOW (ref 8.9–10.3)
Chloride: 104 mmol/L (ref 98–111)
Creatinine, Ser: 1 mg/dL (ref 0.44–1.00)
GFR, Estimated: >60 mL/min (ref >60)
Glucose, Bld: 90 mg/dL (ref 70–99)
Potassium: 4.1 mmol/L (ref 3.5–5.1)
Sodium: 136 mmol/L (ref 135–145)
Total Bilirubin: 0.7 mg/dL (ref 0.0–1.2)
Total Protein: 7.4 g/dL (ref 6.5–8.1)

## 2024-03-03 LAB — LIPID PANEL
Cholesterol: 139 mg/dL (ref 0–200)
HDL: 36 mg/dL — ABNORMAL LOW (ref >40)
LDL Cholesterol: 81 mg/dL (ref 0–99)
Total CHOL/HDL Ratio: 3.9 ratio
Triglycerides: 110 mg/dL (ref <150)
VLDL: 22 mg/dL (ref 0–40)

## 2024-03-03 LAB — BRAIN NATRIURETIC PEPTIDE: B Natriuretic Peptide: 13.3 pg/mL (ref 0.0–100.0)

## 2024-03-03 MED ORDER — SACUBITRIL-VALSARTAN 49-51 MG PO TABS: 1.0000 | ORAL_TABLET | Freq: Two times a day (BID) | ORAL | 11 refills | Status: DC

## 2024-03-03 MED ORDER — FUROSEMIDE 40 MG PO TABS: 40.0000 mg | ORAL_TABLET | ORAL | 6 refills | Status: DC

## 2024-03-03 MED ORDER — FUROSEMIDE 40 MG PO TABS: 40.0000 mg | ORAL_TABLET | ORAL | 3 refills | Status: AC

## 2024-03-03 NOTE — Progress Notes (Signed)
 PCP: Frann Mabel Mt, DO Cardiology: Dr. Okey HF Cardiology: Dr. Rolan  43 y.o. with history of cardiomyopathy was referred by Dr. Okey for evaluation of CHF.  In 12/23, patient developed abdominal pain, nausea, vomiting, and diarrhea. RUQ US  showed fatty liver.  She does not drink ETOH.  She was thought to have NAFLD.  She also noted the development of significant exertional dyspnea with most activities, even just walking around the house.  Echo was done in 1/24 showing EF 20-25%, severe functional MR, mild RV dysfunction. ECG showed LBBB.  She was started on Lasix  for diuresis.  RHC/LHC in 1/24 showed no CAD, elevated filling pressures and low but not markedly low cardiac output.     Cardiac MRI in 5/24 showed EF 35%, septal-lateral dyssynchrony, RV EF 31%, no LGE, moderate MR with regurgitant fraction 26%. Echo in 7/24 showed EF 35-40%, septal-lateral dyssynchrony, normal RV, mild-moderate MR.   Cardiac MRI in 1/25 showed LV EF 52%, septal-lateral dyssynchrony, RV EF 42%, no LGE, ECV 26%.   Today she returns for HF follow up. Overall feeling fine. Mild SOB when very active at work. She is dizzy, BP at home systolic in 90's. Feels palpitations. Denies abnormal bleeding, CP, edema, or PND/Orthopnea. Appetite ok. Weight at home 228 pounds, has lost 40 lbs on GLP1. Taking all medications. Works in dialysis unit.   ReDs reading: 20 %, abnormal  ECG (personally reviewed): none ordered today  Labs (2/24): Pro-BNP 1519, K 3.7, creatinine 0.93 Labs (3/24): K 4.6, creatinine 0.83, AST 42, ALT 50 Labs (4/24): BNP 43, K 4.3, creatinine 0.83 Labs (5/24): LDL 151 Labs (7/24): K 4.6, creatinine 0.81, normal LFTs Labs (10/24): TSH normal, BNP 21, K 4.3, creatinine 0.8 Labs (12/24): LFTs normal, LDL 98 Labs (2/25): BNP 19.9 Labs (4/25): LDL 88, K 4.2, creatinine 9.18 Labs (5/25): K 4.9, creatinine 0.99  PMH:  1. LBBB 2. Chronic systolic CHF: Echo (1/24) with EF 20-25%, severe functional MR,  mild RV dysfunction.  - LHC/RHC (1/24): No significant CAD; mean RA 15, PA 58/21 mean 37, mean PCWP 26, CI 2.18, PVR 2.3 WU, PAPi 2.5.  - Cardiac MRI in 5/24: EF 35%, septal-lateral dyssynchrony, RV EF 31%, no LGE, moderate MR with regurgitant fraction 26%.  - Echo (7/24): EF 35-40%, septal-lateral dyssynchrony, normal RV, mild-moderate MR.  - Cardiac MRI (1/25): LV EF 52%, septal-lateral dyssynchrony, RV EF 42%, no LGE, ECV 26% 3. Mitral regurgitation: Suspect functional, severe on 1/24 echo.  4. NAFLD: Chronic elevated LFTs.  - Abdominal US  (1/24): Fatty liver.  5. Zio monitor 11/24 with no significant arrhythmias.   SH: Married, lives in [place], works as teacher, early years/pre, no drugs, no ETOH, no smoking.   FH: No family history of CHF or sudden death.   ROS: All systems reviewed and negative except as per HPI.   Current Outpatient Medications  Medication Sig Dispense Refill   carvedilol  (COREG ) 6.25 MG tablet Take 1 tablet (6.25 mg total) by mouth 2 (two) times daily with a meal. 60 tablet 3   cetirizine (ZYRTEC) 10 MG tablet Take 10 mg by mouth daily.     empagliflozin  (JARDIANCE ) 10 MG TABS tablet Take 1 tablet (10 mg total) by mouth daily. 90 tablet 3   furosemide  (LASIX ) 40 MG tablet Take 1 tablet (40 mg total) by mouth daily. 30 tablet 6   potassium chloride  SA (KLOR-CON  M) 10 MEQ tablet Take 1 tablet (10 mEq total) by mouth daily. 90 tablet 3   rosuvastatin  (CRESTOR )  5 MG tablet TAKE 1 TABLET(5 MG) BY MOUTH DAILY 30 tablet 11   sacubitril -valsartan  (ENTRESTO ) 97-103 MG Take 1 tablet by mouth 2 (two) times daily. 180 tablet 3   spironolactone  (ALDACTONE ) 25 MG tablet TAKE 1 TABLET(25 MG) BY MOUTH DAILY 90 tablet 3   tirzepatide  (MOUNJARO ) 12.5 MG/0.5ML Pen Inject 12.5 mg into the skin once a week. 2 mL 0   pantoprazole  (PROTONIX ) 40 MG tablet TAKE 1 TABLET(40 MG) BY MOUTH DAILY 90 tablet 0   No current facility-administered medications for this encounter.   Wt Readings  from Last 3 Encounters:  03/03/24 104.9 kg (231 lb 3.2 oz)  09/14/23 121.6 kg (268 lb)  08/22/23 122.4 kg (269 lb 12.8 oz)   BP 90/68   Pulse 93   Wt 104.9 kg (231 lb 3.2 oz)   SpO2 98%   BMI 40.96 kg/m   Physical Exam: General:  NAD. No resp difficulty HEENT: Normal Neck: Supple. No JVD. Cor: Regular rate & rhythm. No rubs, gallops or murmurs. Lungs: Clear Abdomen: Soft, nontender, nondistended.  Extremities: No cyanosis, clubbing, rash, edema Neuro: Alert & oriented x 3, moves all 4 extremities w/o difficulty. Affect pleasant.  Assessment/Plan: 1. Chronic systolic CHF: Nonischemic cardiomyopathy.  Echo in 1/24 showed EF 20-25%, severe functional MR, mild RV dysfunction. ECG showed LBBB.  RHC/LHC in 1/24 showed no CAD, elevated filling pressures and low but not markedly low cardiac output. No ETOH or drug history.  TSH normal.  No family history of cardiomyopathy or sudden death.  ?Viral myocarditis as etiology or LBBB cardiomyopathy.  Cardiac MRI in 5/24 showed EF 35%, septal-lateral dyssynchrony, RV EF 31%, no LGE, moderate MR with regurgitant fraction 26%. Echo 7/24 showed EF 35-40%, septal-lateral dyssynchrony, normal RV, mild-moderate MR. Cardiac MRI in 1/25 with improvement, LV EF 52%, septal-lateral dyssynchrony, RV EF 42%, no LGE, ECV 26%.  NYHA class II, mildly hypovolemic on exam and with orthostasis. ReDs 20% - Decrease Lasix  40 mg to MWF - Decrease Entresto  to 49/51 mg bid. BMET and BNP today. - Continue Coreg  6.25 mg bid (dizzy at higher doses).  - Continue spironolactone  25 mg daily. - Continue Jardiance  10 mg daily.  - EF is out of range for CRT-D.  - Encouraged contraception, discussed feto-toxicity of GDMT.  - Repeat echo in 6 months to ensure EF stable. 2. Mitral regurgitation: Severe on 1/24 echo though she does not have a loud murmur.  Suspect functional.  Echo 7/24 showed mild-moderate MR.  3. Elevated LFTs: Fatty liver on US , suspect NAFLD.  Congestive  hepatopathy likely plays a role.  Most recent LFTs normalized. She does not drink a significant amount of ETOH or regularly use acetaminophen  - Check LFTs today. 4. Hyperlipidemia: Continue Crestor , 12/24 LDL improved.  Check LFTs/lipids today. 5. Obesity: Body mass index is 40.96 kg/m. Down 40 lbs, congratulated. - Continue Mounjaro .   Follow up in 6 months with Dr. Rolan + echo  She was given a note for her employer today. If EF stable on repeat echo, work restrictions can be lifted from a cardiac standpoint. We discussed this today.  Harlene HERO [place], FNP-BC 03/03/2024

## 2024-03-03 NOTE — Progress Notes (Signed)
 ReDS Vest / Clip - 03/03/24 1000       ReDS Vest / Clip   Station Marker B    Ruler Value 39    ReDS Value Range Low volume    ReDS Actual Value 20

## 2024-03-03 NOTE — Patient Instructions (Addendum)
 Thank you for coming in today  If you had labs drawn today, any labs that are abnormal the clinic will call you No news is good news  Medications: Decrease Entresto  to 49/51 mg 1 1 tablet twice daily  Change Lasix  to 40 mg every Monday Wednesday and Friday  Ok to use Miralax daily for constipation  Follow up appointments:  Your physician recommends that you schedule a follow-up appointment in:  6 months With Dr. Rolan with echocardiogram Please call our office to schedule the follow-up appointment in March/April 2026 for May 2026   Your physician has requested that you have an echocardiogram. Echocardiography is a painless test that uses sound waves to create images of your heart. It provides your doctor with information about the size and shape of your heart and how well your heart's chambers and valves are working. This procedure takes approximately one hour. There are no restrictions for this procedure.       Do the following things EVERYDAY: Weigh yourself in the morning before breakfast. Write it down and keep it in a log. Take your medicines as prescribed Eat low salt foods--Limit salt (sodium) to 2000 mg per day.  Stay as active as you can everyday Limit all fluids for the day to less than 2 liters   At the [facility], you and your health needs are our priority. As part of our continuing mission to provide you with exceptional heart care, we have created designated Provider Care Teams. These Care Teams include your primary Cardiologist (physician) and Advanced Practice Providers (APPs- Physician Assistants and Nurse Practitioners) who all work together to provide you with the care you need, when you need it.   You may see any of the following providers on your designated Care Team at your next follow up: Dr Toribio Fuel Dr Ezra Rolan Dr. Ria Gardenia Greig Lenetta, NP Caffie Shed, [place] [facility] [place], [place] Beckey Coe,  NP Tinnie Redman, PharmD   Please be sure to bring in all your medications bottles to every appointment.    Thank you for choosing [facility]  If you have any questions or concerns before your next appointment please send us  a message through [place] or call our office at [phone].    TO LEAVE A MESSAGE FOR THE NURSE SELECT OPTION 2, PLEASE LEAVE A MESSAGE INCLUDING: YOUR NAME DATE OF BIRTH CALL BACK NUMBER REASON FOR CALL**this is important as we prioritize the call backs  YOU WILL RECEIVE A CALL BACK THE SAME DAY AS LONG AS YOU CALL BEFORE 4:00 PM

## 2024-03-04 ENCOUNTER — Ambulatory Visit (HOSPITAL_COMMUNITY): Payer: Self-pay | Admitting: Family Medicine

## 2024-03-07 ENCOUNTER — Other Ambulatory Visit (HOSPITAL_COMMUNITY): Payer: Self-pay

## 2024-03-07 MED ORDER — ROSUVASTATIN CALCIUM 5 MG PO TABS: 5.0000 mg | ORAL_TABLET | Freq: Every day | ORAL | 3 refills | Status: AC

## 2024-03-07 MED ORDER — SACUBITRIL-VALSARTAN 49-51 MG PO TABS: 1.0000 | ORAL_TABLET | Freq: Two times a day (BID) | ORAL | 3 refills | Status: AC

## 2024-03-17 ENCOUNTER — Other Ambulatory Visit (HOSPITAL_COMMUNITY): Payer: Self-pay | Admitting: Cardiology

## 2024-03-18 MED ORDER — MOUNJARO 12.5 MG/0.5ML ~~LOC~~ SOAJ: 12.5000 mg | SUBCUTANEOUS | 0 refills | Status: DC

## 2024-03-18 NOTE — Addendum Note (Signed)
 Addended by: DARRELL BRUCKNER on: 03/18/2024 11:51 AM   Modules accepted: Orders

## 2024-04-04 ENCOUNTER — Other Ambulatory Visit: Payer: Self-pay | Admitting: Cardiology

## 2024-04-07 ENCOUNTER — Telehealth (HOSPITAL_COMMUNITY): Payer: Self-pay

## 2024-04-07 NOTE — Telephone Encounter (Signed)
 Received a fax requesting medical records from [place]  [employer]. Records were successfully faxed to: [phone] ,which was the number provided.. Medical request form will be scanned into patients chart.   Phone Number: [phone]

## 2024-04-16 ENCOUNTER — Ambulatory Visit

## 2024-04-16 ENCOUNTER — Telehealth (HOSPITAL_COMMUNITY): Payer: Self-pay

## 2024-04-16 NOTE — Telephone Encounter (Signed)
 Paper work faxed to [employer] at [phone]. My chart message sent to patient to inform her.

## 2024-05-21 ENCOUNTER — Other Ambulatory Visit (HOSPITAL_COMMUNITY): Payer: Self-pay | Admitting: Cardiology

## 2024-05-21 NOTE — Telephone Encounter (Signed)
 Refill requested for munjaro and next dose of 15 mg  Message to enterprise products team

## 2024-05-22 MED ORDER — MOUNJARO 15 MG/0.5ML ~~LOC~~ SOAJ: 15.0000 mg | SUBCUTANEOUS | 5 refills | Status: AC

## 2024-08-11 ENCOUNTER — Encounter: Admitting: Family Medicine
